# Patient Record
Sex: Male | Born: 1955 | Hispanic: No | Marital: Married | Smoking: Former smoker
Health system: Southern US, Community
[De-identification: ages and names within clinical notes are randomized; demographics above are authoritative.]

## PROBLEM LIST (undated history)

## (undated) DIAGNOSIS — I2510 Atherosclerotic heart disease of native coronary artery without angina pectoris: Secondary | ICD-10-CM

## (undated) DIAGNOSIS — I252 Old myocardial infarction: Secondary | ICD-10-CM

## (undated) DIAGNOSIS — E785 Hyperlipidemia, unspecified: Secondary | ICD-10-CM

## (undated) DIAGNOSIS — I10 Essential (primary) hypertension: Secondary | ICD-10-CM

## (undated) HISTORY — DX: Old myocardial infarction: I25.2

## (undated) HISTORY — PX: VARICOSE VEIN SURGERY: SHX832

## (undated) HISTORY — DX: Atherosclerotic heart disease of native coronary artery without angina pectoris: I25.10

## (undated) HISTORY — PX: CORONARY ANGIOPLASTY: SHX604

## (undated) HISTORY — PX: NASAL SINUS SURGERY: SHX719

## (undated) HISTORY — DX: Essential (primary) hypertension: I10

## (undated) HISTORY — PX: DENTAL SURGERY: SHX609

## (undated) HISTORY — DX: Hyperlipidemia, unspecified: E78.5

---

## 2007-02-13 DIAGNOSIS — I252 Old myocardial infarction: Secondary | ICD-10-CM

## 2007-02-13 HISTORY — DX: Old myocardial infarction: I25.2

## 2011-03-19 DIAGNOSIS — Z0000 Encounter for general adult medical examination without abnormal findings: Secondary | ICD-10-CM | POA: Insufficient documentation

## 2013-03-02 DIAGNOSIS — J329 Chronic sinusitis, unspecified: Secondary | ICD-10-CM | POA: Insufficient documentation

## 2014-09-01 DIAGNOSIS — I2510 Atherosclerotic heart disease of native coronary artery without angina pectoris: Secondary | ICD-10-CM | POA: Insufficient documentation

## 2014-09-01 DIAGNOSIS — I252 Old myocardial infarction: Secondary | ICD-10-CM | POA: Insufficient documentation

## 2015-06-24 ENCOUNTER — Ambulatory Visit: Payer: Self-pay | Admitting: Cardiovascular Disease

## 2015-07-19 ENCOUNTER — Ambulatory Visit: Payer: Self-pay | Admitting: Cardiovascular Disease

## 2015-07-20 ENCOUNTER — Ambulatory Visit: Payer: Self-pay | Admitting: Cardiovascular Disease

## 2015-07-28 ENCOUNTER — Telehealth: Payer: Self-pay | Admitting: Cardiovascular Disease

## 2015-07-28 NOTE — Telephone Encounter (Signed)
Received records from [facility] for appointment on 07/29/15 with Dr Gwenlyn Found.  Records given to [facility] (medical records) for Dr Kennon Holter schedule on 07/29/15.  lp

## 2015-07-29 ENCOUNTER — Ambulatory Visit (INDEPENDENT_AMBULATORY_CARE_PROVIDER_SITE_OTHER): Payer: 59 | Admitting: Cardiovascular Disease

## 2015-07-29 ENCOUNTER — Encounter: Payer: Self-pay | Admitting: Cardiovascular Disease

## 2015-07-29 VITALS — BP 154/85 | HR 86 | Ht 73.0 in | Wt 234.8 lb

## 2015-07-29 DIAGNOSIS — I25700 Atherosclerosis of coronary artery bypass graft(s), unspecified, with unstable angina pectoris: Secondary | ICD-10-CM

## 2015-07-29 DIAGNOSIS — E785 Hyperlipidemia, unspecified: Secondary | ICD-10-CM | POA: Insufficient documentation

## 2015-07-29 DIAGNOSIS — I25810 Atherosclerosis of coronary artery bypass graft(s) without angina pectoris: Secondary | ICD-10-CM | POA: Insufficient documentation

## 2015-07-29 DIAGNOSIS — I10 Essential (primary) hypertension: Secondary | ICD-10-CM | POA: Diagnosis not present

## 2015-07-29 MED ORDER — ASPIRIN 81 MG PO TABS: 81.0000 mg | ORAL_TABLET | Freq: Every day | ORAL | Status: AC

## 2015-07-29 MED ORDER — COQ10 100 MG PO CAPS: 200.0000 mg | ORAL_CAPSULE | Freq: Every day | ORAL | Status: DC

## 2015-07-29 MED ORDER — ATORVASTATIN CALCIUM 40 MG PO TABS: ORAL_TABLET | ORAL | Status: DC

## 2015-07-29 MED ORDER — LISINOPRIL 10 MG PO TABS: ORAL_TABLET | ORAL | Status: DC

## 2015-07-29 NOTE — Assessment & Plan Note (Signed)
History of hyperlipidemia on atorvastatin 40 mg a day with an LDL in the low 70s followed by his PCP.

## 2015-07-29 NOTE — Assessment & Plan Note (Signed)
History of CAD status post inferior STEMI 7/4/09in [place] with stenting of his LAD  2. He is on low-dose aspirin. He denies chest pain or shortness of breath. Apparently his LV function is normal but we will get copies of his most recent [facility] cardiology in [facility]. I will see him back in one year for follow-up

## 2015-07-29 NOTE — Progress Notes (Signed)
     07/29/2015 Randall Estes   Jan 05, 1956  [id number]  Primary Physician Helane Rima, MD Primary Cardiologist: Lorretta Harp MD Renae Gloss  HPI:  Randall Estes this a very pleasant 60 year old mildly overweight married Caucasian male father of 2, grandfather and one grandchild who  does 3-D knitting. He is self-referred to be established in our practice because of known coronary artery disease for ongoing care. He has a history of 70 pack years of tobacco abuse having quit 08/16/07.He does drink 2-3 drinks a day He has treated hypertension and hyperlipidemia. He had an anterior STEMI 08/16/07 in [place] and had stenting of his LAD at that time. Apparently he has preserved LV function. He works out at [employer] 3 times a week and does cardiovascular workout for 45 minutes without symptoms.   No current outpatient prescriptions on file.   No current facility-administered medications for this visit.    Not on File  Social History   Social History  . Marital Status: Married    Spouse Name: N/A  . Number of Children: N/A  . Years of Education: N/A   Occupational History  . Not on file.   Social History Main Topics  . Smoking status: Former Research scientist (life sciences)  . Smokeless tobacco: Not on file  . Alcohol Use: Not on file  . Drug Use: Not on file  . Sexual Activity: Not on file   Other Topics Concern  . Not on file   Social History Narrative  . No narrative on file     Review of Systems: General: negative for chills, fever, night sweats or weight changes.  Cardiovascular: negative for chest pain, dyspnea on exertion, edema, orthopnea, palpitations, paroxysmal nocturnal dyspnea or shortness of breath Dermatological: negative for rash Respiratory: negative for cough or wheezing Urologic: negative for hematuria Abdominal: negative for nausea, vomiting, diarrhea, bright red blood per rectum, melena, or hematemesis Neurologic: negative for visual changes, syncope, or  dizziness All other systems reviewed and are otherwise negative except as noted above.    Blood pressure 154/85, pulse 86, height 6\' 1"  (1.854 m), weight 234 lb 12.8 oz (106.505 kg).  General appearance: alert and no distress Neck: no adenopathy, no carotid bruit, no JVD, supple, symmetrical, trachea midline and thyroid not enlarged, symmetric, no tenderness/mass/nodules Lungs: clear to auscultation bilaterally Heart: regular rate and rhythm, S1, S2 normal, no murmur, click, rub or gallop Extremities: extremities normal, atraumatic, no cyanosis or edema  EKG normal sinus rhythm at 63 with septal Q waves consistent with old septal infarct. I personally reviewed this EKG  ASSESSMENT AND PLAN:   Essential hypertension History of hypertension on lisinopril with blood pressure measured today 154/85. Continue current meds at current dosing  Hyperlipidemia History of hyperlipidemia on atorvastatin 40 mg a day with an LDL in the low 70s followed by his PCP.  CAD (coronary artery disease) of artery bypass graft History of CAD status post inferior STEMI 7/4/09in [place] with stenting of his LAD  2. He is on low-dose aspirin. He denies chest pain or shortness of breath. Apparently his LV function is normal but we will get copies of his most recent [facility] cardiology in [facility]. I will see him back in one year for follow-up      Lorretta Harp MD [facility], [facility] 07/29/2015 8:37 AM

## 2015-07-29 NOTE — Assessment & Plan Note (Signed)
History of hypertension on lisinopril with blood pressure measured today 154/85. Continue current meds at current dosing

## 2015-07-29 NOTE — Patient Instructions (Signed)

## 2016-03-15 DIAGNOSIS — E669 Obesity, unspecified: Secondary | ICD-10-CM | POA: Insufficient documentation

## 2016-03-15 LAB — HM HEPATITIS C SCREENING LAB: HM Hepatitis Screen: NEGATIVE

## 2016-04-10 ENCOUNTER — Other Ambulatory Visit: Payer: Self-pay | Admitting: *Deleted

## 2016-04-10 MED ORDER — LISINOPRIL 10 MG PO TABS: ORAL_TABLET | ORAL | 2 refills | Status: DC

## 2016-06-06 ENCOUNTER — Other Ambulatory Visit: Payer: Self-pay | Admitting: *Deleted

## 2016-06-06 MED ORDER — ATORVASTATIN CALCIUM 40 MG PO TABS: ORAL_TABLET | ORAL | 3 refills | Status: DC

## 2016-06-06 MED ORDER — LISINOPRIL 10 MG PO TABS: ORAL_TABLET | ORAL | 3 refills | Status: DC

## 2016-07-24 DIAGNOSIS — I2510 Atherosclerotic heart disease of native coronary artery without angina pectoris: Secondary | ICD-10-CM | POA: Insufficient documentation

## 2016-07-31 ENCOUNTER — Encounter: Payer: Self-pay | Admitting: Cardiovascular Disease

## 2016-07-31 ENCOUNTER — Ambulatory Visit (INDEPENDENT_AMBULATORY_CARE_PROVIDER_SITE_OTHER): Payer: 59 | Admitting: Cardiovascular Disease

## 2016-07-31 VITALS — BP 150/68 | HR 69 | Ht 73.0 in | Wt 233.6 lb

## 2016-07-31 DIAGNOSIS — I10 Essential (primary) hypertension: Secondary | ICD-10-CM | POA: Diagnosis not present

## 2016-07-31 DIAGNOSIS — I2510 Atherosclerotic heart disease of native coronary artery without angina pectoris: Secondary | ICD-10-CM

## 2016-07-31 DIAGNOSIS — E7800 Pure hypercholesterolemia, unspecified: Secondary | ICD-10-CM

## 2016-07-31 NOTE — Assessment & Plan Note (Signed)
History of essential hypertension blood pressure measured at 150/68. He is on lisinopril. Continue current meds current dosing.

## 2016-07-31 NOTE — Assessment & Plan Note (Signed)
History of CAD status post anterior STEMI 08/16/07 in [place]. With stenting of his LAD at that time. He had preserved LV function. He works out regularly and is denies chest pain or shortness of breath.

## 2016-07-31 NOTE — Progress Notes (Signed)
07/31/2016 Randall Estes   1955/07/12  [id number]  Primary Physician Helane Rima, MD Primary Cardiologist: Lorretta Harp MD Renae Gloss  HPI:  Randall Estes this a very pleasant 61 year old mildly overweight married Caucasian male father of 2, grandfather and one grandchild who  does 3-D knitting. He is self-referred to be established in our practice because of known coronary artery disease for ongoing care. I last saw him in the office 07/29/15. He has a history of 70 pack years of tobacco abuse having quit 08/16/07.He does drink 2-3 drinks a day He has treated hypertension and hyperlipidemia. He had an anterior STEMI 08/16/07 in [place] and had stenting of his LAD at that time. Apparently he had preserved LV function. He works out at [employer] 3 times a week and does cardiovascular workout for 45 minutes without symptoms.   Current Outpatient Prescriptions  Medication Sig Dispense Refill  . aspirin 81 MG tablet Take 1 tablet (81 mg total) by mouth daily. 90 tablet 3  . atorvastatin (LIPITOR) 40 MG tablet TAKE 1 TABLET (40 MG TOTAL) BY MOUTH DAILY. 90 tablet 3  . Coenzyme Q10 (COQ10) 100 MG CAPS Take 200 mg by mouth daily. 90 each 3  . lisinopril (PRINIVIL,ZESTRIL) 10 MG tablet TAKE 0.5 TABLETS (5 MG TOTAL) BY MOUTH DAILY. 45 tablet 3  . MULTIPLE VITAMIN PO Take 1 tablet by mouth daily.     No current facility-administered medications for this visit.     Not on File  Social History   Social History  . Marital status: Married    Spouse name: N/A  . Number of children: N/A  . Years of education: N/A   Occupational History  . Not on file.   Social History Main Topics  . Smoking status: Former Research scientist (life sciences)  . Smokeless tobacco: Never Used  . Alcohol use Not on file  . Drug use: Unknown  . Sexual activity: Not on file   Other Topics Concern  . Not on file   Social History Narrative  . No narrative on file     Review of Systems: General: negative for  chills, fever, night sweats or weight changes.  Cardiovascular: negative for chest pain, dyspnea on exertion, edema, orthopnea, palpitations, paroxysmal nocturnal dyspnea or shortness of breath Dermatological: negative for rash Respiratory: negative for cough or wheezing Urologic: negative for hematuria Abdominal: negative for nausea, vomiting, diarrhea, bright red blood per rectum, melena, or hematemesis Neurologic: negative for visual changes, syncope, or dizziness All other systems reviewed and are otherwise negative except as noted above.    Blood pressure (!) 150/68, pulse 69, height 6\' 1"  (1.854 m), weight 233 lb 9.6 oz (106 kg).  General appearance: cooperative and no distress Neck: no adenopathy, no carotid bruit, no JVD, supple, symmetrical, trachea midline and thyroid not enlarged, symmetric, no tenderness/mass/nodules Lungs: clear to auscultation bilaterally Heart: regular rate and rhythm, S1, S2 normal, no murmur, click, rub or gallop Extremities: extremities normal, atraumatic, no cyanosis or edema  EKG sinus rhythm at 69 with septal Q waves and left axis deviation. Per se reviewed this EKG.  ASSESSMENT AND PLAN:   Essential hypertension History of essential hypertension blood pressure measured at 150/68. He is on lisinopril. Continue current meds current dosing.  Hyperlipidemia History of hyperlipidemia on statin therapy followed by his PCP.  CAD (coronary artery disease) History of CAD status post anterior STEMI 08/16/07 in [place]. With stenting of his LAD at that time. He had preserved LV  function. He works out regularly and is denies chest pain or shortness of breath.      Lorretta Harp MD FACP,FACC,FAHA, [facility] 07/31/2016 3:17 PM

## 2016-07-31 NOTE — Assessment & Plan Note (Signed)
History of hyperlipidemia on statin therapy followed by his PCP 

## 2016-07-31 NOTE — Patient Instructions (Signed)

## 2016-09-17 NOTE — Progress Notes (Signed)
[facility] [address] [place], [place] [address] Phone: [phone] Subjective:    I'm seeing this patient by the request  of:    CC: Buttocks pain  [id number]  Randall Estes is a 61 y.o. male coming in with complaint of right glute pain. He has had the pain since last summer.  No mechanism of injury.  Onset- last summer Location- right Duration- intermittent Character- sharp, dull, achy Aggravating factors- patient cannot find pattern Reliving factors- stretching Therapies tried-  Severity- 10/10 at it's worst    Past Medical History:  Diagnosis Date  . Coronary artery disease   . Hyperlipidemia   . Hypertension   . Old MI (myocardial infarction)    Past Surgical History:  Procedure Laterality Date  . CORONARY ANGIOPLASTY     Social History   Social History  . Marital status: Married    Spouse name: N/A  . Number of children: N/A  . Years of education: N/A   Social History Main Topics  . Smoking status: Former Research scientist (life sciences)  . Smokeless tobacco: Never Used  . Alcohol use Not on file  . Drug use: Unknown  . Sexual activity: Not on file   Other Topics Concern  . Not on file   Social History Narrative  . No narrative on file   Not on File No family history on file.   Past medical history, social, surgical and family history all reviewed in electronic medical record.  No pertanent information unless stated regarding to the chief complaint.   Review of Systems:Review of systems updated and as accurate as of 09/17/16  No headache, visual changes, nausea, vomiting, diarrhea, constipation, dizziness, abdominal pain, skin rash, fevers, chills, night sweats, weight loss, swollen lymph nodes, body aches, joint swelling, muscle aches, chest pain, shortness of breath, mood changes.   Objective  There were no vitals taken for this visit. Systems examined below as of 09/17/16   General: No apparent distress alert and oriented x3  mood and affect normal, dressed appropriately.  HEENT: Pupils equal, extraocular movements intact  Respiratory: Patient's speak in full sentences and does not appear short of breath  Cardiovascular: No lower extremity edema, non tender, no erythema  Skin: Warm dry intact with no signs of infection or rash on extremities or on axial skeleton.  Abdomen: Soft nontender  Neuro: Cranial nerves II through XII are intact, neurovascularly intact in all extremities with 2+ DTRs and 2+ pulses.  Lymph: No lymphadenopathy of posterior or anterior cervical chain or axillae bilaterally.  Gait normal with good balance and coordination.  MSK:  Non tender with full range of motion and good stability and symmetric strength and tone of shoulders, elbows, wrist, hip, knee and ankles bilaterally.  Back Exam:  Inspection: Unremarkable  Motion: Flexion 35 deg, Extension 25 deg, Side Bending to 35 deg bilaterally,  Rotation to 35 deg bilaterally  SLR laying: Negative  XSLR laying: Negative  Palpable tenderness: Tenderness over the gluteus medius on the right side only. FABER: negative. Sensory change: Gross sensation intact to all lumbar and sacral dermatomes.  Reflexes: 2+ at both patellar tendons, 2+ at achilles tendons, Babinski's downgoing.  Strength at foot  Plantar-flexion: 5/5 Dorsi-flexion: 5/5 Eversion: 5/5 Inversion: 5/5  Leg strength  Quad: 5/5 Hamstring: 5/5 Hip flexor: 5/5 Hip abductors: 5/5  Gait unremarkable.  Procedure note 24235; 15 additional minutes spent for Therapeutic exercises as stated in above notes.  This included exercises focusing on stretching, strengthening, with  significant focus on eccentric aspects.   Long term goals include an improvement in range of motion, strength, endurance as well as avoiding reinjury. Patient's frequency would include in 1-2 times a day, 3-5 times a week for a duration of 6-12 weeks.  Hip strengthening exercises which included:  Pelvic tilt/bracing to help  with proper recruitment of the lower abs and pelvic floor muscles  Glute strengthening to properly contract glutes without over-engaging low back and hamstrings - prone hip extension and glute bridge exercises Proper stretching techniques to increase effectiveness for the hip flexors, groin, quads, piriformic and low back when appropriate   Proper technique shown and discussed handout in great detail with ATC.  All questions were discussed and answered.     Impression and Recommendations:     This case required medical decision making of moderate complexity.      Note: This dictation was prepared with Dragon dictation along with smaller phrase technology. Any transcriptional errors that result from this process are unintentional.

## 2016-09-18 ENCOUNTER — Ambulatory Visit (INDEPENDENT_AMBULATORY_CARE_PROVIDER_SITE_OTHER): Payer: 59 | Admitting: Family Medicine

## 2016-09-18 ENCOUNTER — Encounter: Payer: Self-pay | Admitting: Family Medicine

## 2016-09-18 DIAGNOSIS — M6798 Unspecified disorder of synovium and tendon, other site: Secondary | ICD-10-CM | POA: Insufficient documentation

## 2016-09-18 DIAGNOSIS — M67951 Unspecified disorder of synovium and tendon, right thigh: Secondary | ICD-10-CM | POA: Insufficient documentation

## 2016-09-18 MED ORDER — DICLOFENAC SODIUM 2 % TD SOLN: 2.0000 "application " | Freq: Two times a day (BID) | TRANSDERMAL | 3 refills | Status: DC

## 2016-09-18 MED ORDER — VITAMIN D (ERGOCALCIFEROL) 1.25 MG (50000 UNIT) PO CAPS: 50000.0000 [IU] | ORAL_CAPSULE | ORAL | 0 refills | Status: DC

## 2016-09-18 NOTE — Patient Instructions (Addendum)
Good to see you.  Ice 20 minutes 2 times daily. Usually after activity and before bed. Exercises 3 times a week.  Decrease elliptical to 51minutes and add 20 minutes of bike.  pennsaid pinkie amount topically 2 times daily as needed.   Once weekly vitamin D for 12 weeks for strength  Stay active Tennis ball in back right pocket when sitting a lot See me again in 3-4 weeks and if not a lot better lets inject

## 2016-09-18 NOTE — Assessment & Plan Note (Signed)
Likely sprain. Discussed with patient at great length, we discussed objective is a doing which ones to avoid. Patient is to increase activity as tolerated. Patient work with a Clinical research associate to discuss home exercises for this. We discussed the possibility of a lumbar radiculopathy. Patient will try once the vitamin D for strength. We also discussed the topical anti-inflammatory for pain relief. Icing regimen. Return in 3-4 weeks. Worsening pain we'll consider injection

## 2016-10-09 ENCOUNTER — Ambulatory Visit: Payer: Self-pay

## 2016-10-09 ENCOUNTER — Ambulatory Visit (INDEPENDENT_AMBULATORY_CARE_PROVIDER_SITE_OTHER): Payer: 59 | Admitting: Family Medicine

## 2016-10-09 ENCOUNTER — Encounter: Payer: Self-pay | Admitting: Family Medicine

## 2016-10-09 ENCOUNTER — Ambulatory Visit (INDEPENDENT_AMBULATORY_CARE_PROVIDER_SITE_OTHER)
Admission: RE | Admit: 2016-10-09 | Discharge: 2016-10-09 | Disposition: A | Discharge status: Home / Self Care | Payer: 59 | Source: Ambulatory Visit | Attending: Family Medicine | Admitting: Family Medicine

## 2016-10-09 VITALS — BP 140/78 | HR 71 | Wt 235.0 lb

## 2016-10-09 DIAGNOSIS — M6798 Unspecified disorder of synovium and tendon, other site: Secondary | ICD-10-CM

## 2016-10-09 DIAGNOSIS — M67951 Unspecified disorder of synovium and tendon, right thigh: Secondary | ICD-10-CM

## 2016-10-09 MED ORDER — GABAPENTIN 100 MG PO CAPS: 200.0000 mg | ORAL_CAPSULE | Freq: Every day | ORAL | 3 refills | Status: DC

## 2016-10-09 NOTE — Progress Notes (Signed)
[facility] [address] [place], [place] [address] Phone: [phone] Subjective:    CC: Buttocks pain  PIR:[id number]  Randall Estes is a 62 y.o. male coming in with complaint of right glute pain. Positive pain. Patient was found to have more of a gluteal tendinitis. Had doing the home exercises but no significant improvement. Did get the different vitamins with no significant improvement. Continues to have dull, throbbing aching pain. Patient is supposed to be traveling and is concerned second her to the discomfort and pain.    Past Medical History:  Diagnosis Date  . Coronary artery disease   . Hyperlipidemia   . Hypertension   . Old MI (myocardial infarction)    Past Surgical History:  Procedure Laterality Date  . CORONARY ANGIOPLASTY     Social History   Social History  . Marital status: Married    Spouse name: N/A  . Number of children: N/A  . Years of education: N/A   Social History Main Topics  . Smoking status: Former Research scientist (life sciences)  . Smokeless tobacco: Never Used  . Alcohol use None  . Drug use: Unknown  . Sexual activity: Not Asked   Other Topics Concern  . None   Social History Narrative  . None   Not on File No family history on file. No known drug allergies   Past medical history, social, surgical and family history all reviewed in electronic medical record.  No pertanent information unless stated regarding to the chief complaint.   Review of Systems:Review of systems updated and as accurate as of 10/09/16  No headache, visual changes, nausea, vomiting, diarrhea, constipation, dizziness, abdominal pain, skin rash, fevers, chills, night sweats, weight loss, swollen lymph nodes, body aches, joint swelling, chest pain, shortness of breath, mood changes. Positive muscle aches  Objective  Blood pressure 140/78, pulse 71, weight 235 lb (106.6 kg), SpO2 96 %.   Systems examined below as of 10/09/16 General: NAD A&O x3 mood,  affect normal  HEENT: Pupils equal, extraocular movements intact no nystagmus Respiratory: not short of breath at rest or with speaking Cardiovascular: No lower extremity edema, non tender Skin: Warm dry intact with no signs of infection or rash on extremities or on axial skeleton. Abdomen: Soft nontender, no masses Neuro: Cranial nerves  intact, neurovascularly intact in all extremities with 2+ DTRs and 2+ pulses. Lymph: No lymphadenopathy appreciated today  Gait normal with good balance and coordination.  MSK: Non tender with full range of motion and good stability and symmetric strength and tone of shoulders, elbows, wrist,  knee hips and ankles bilaterally.   Back Exam:  Inspection: Unremarkable  Motion: Flexion 35 deg, Extension 25 deg, Side Bending to 35 deg bilaterally,  Rotation to 35 deg bilaterally  SLR laying: Negative  XSLR laying: Negative  Palpable tenderness: Tenderness over the gluteus medius on the right side only. FABER: negative. Sensory change: Gross sensation intact to all lumbar and sacral dermatomes.  Reflexes: 2+ at both patellar tendons, 2+ at achilles tendons, Babinski's downgoing.  Strength at foot  Plantar-flexion: 5/5 Dorsi-flexion: 5/5 Eversion: 5/5 Inversion: 5/5  Leg strength  Quad: 5/5 Hamstring: 5/5 Hip flexor: 5/5 Hip abductors: 5/5  Gait unremarkable.  Procedure: Real-time Ultrasound Guided Injection of right gluteal tendon sheath Device: GE Logiq Q7 Ultrasound guided injection is preferred based studies that show increased duration, increased effect, greater accuracy, decreased procedural pain, increased response rate, and decreased cost with ultrasound guided versus blind injection.  Verbal informed  consent obtained.  Time-out conducted.  Noted no overlying erythema, induration, or other signs of local infection.  Skin prepped in a sterile fashion.  Local anesthesia: Topical Ethyl chloride.  With sterile technique and under real time ultrasound  guidance: With a 21-gauge 2 inch needle patient was injected with a total of 1 mL of 0.5% Marcaine and 1 mL of Kenalog 40 mg/dL into the tendon sheath.  Completed without difficulty  Pain immediately resolved suggesting accurate placement of the medication.  Advised to call if fevers/chills, erythema, induration, drainage, or persistent bleeding.  Images permanently stored and available for review in the ultrasound unit.  Impression: Technically successful ultrasound guided injection.      Impression and Recommendations:     This case required medical decision making of moderate complexity.      Note: This dictation was prepared with Dragon dictation along with smaller phrase technology. Any transcriptional errors that result from this process are unintentional.

## 2016-10-09 NOTE — Assessment & Plan Note (Signed)
No improvement with conservative therapy. Injected today. Discussed icing regimen, home exercises roughly patient does significant a better. Patient could be a candidate for PRP. X-rays lumbar spine ordered today to further evaluate for any lumbar radiculopathy that is within the differential. Encouraged gabapentin at night. Follow-up again in 3-4 weeks

## 2016-10-09 NOTE — Patient Instructions (Addendum)
Good to see you  Injected the glut tendon today and I hope it helps.  Ice is your friend Xray of back downstairs Gabapentin 200mg  at night Take it easy for 2 days then start the exercises again.  See me again in 3 weeks.

## 2016-10-30 ENCOUNTER — Ambulatory Visit (INDEPENDENT_AMBULATORY_CARE_PROVIDER_SITE_OTHER): Payer: 59 | Admitting: Family Medicine

## 2016-10-30 ENCOUNTER — Encounter: Payer: Self-pay | Admitting: Family Medicine

## 2016-10-30 DIAGNOSIS — M6798 Unspecified disorder of synovium and tendon, other site: Secondary | ICD-10-CM | POA: Diagnosis not present

## 2016-10-30 DIAGNOSIS — M67951 Unspecified disorder of synovium and tendon, right thigh: Secondary | ICD-10-CM

## 2016-10-30 NOTE — Assessment & Plan Note (Signed)
Patient didn't do well with the injection. We discussed the possibility of nitroglycerin as well as PRP. Patient declined both with him doing better. We discussed the possibility of the gabapentin as well which patient has not been taking. Patient will continue conservative therapy and see me again on an as-needed basis. Can repeat injection every 10 weeks if needed

## 2016-10-30 NOTE — Patient Instructions (Signed)
6 weeks

## 2016-10-30 NOTE — Progress Notes (Signed)
[facility] [address] [place], [place] [address] Phone: [phone] Subjective:    CC: Buttocks pain  [id number]  Randall Estes is a 61 y.o. male coming in with complaint of right glute pain. Positive pain. Patient was found to have more of a gluteal tendinitis. Patient was having worsening pain and we did give an injection. Patient did tolerate the procedure well 4 weeks ago. States that 70% of the pain is significantly improved. Patient still states that sitting seems to give it some discomfort. His lungs patient does some home exercises, stretches, he seems to do well. Has been working on a more regular basis. Fairly happy with the results.    Past Medical History:  Diagnosis Date  . Coronary artery disease   . Hyperlipidemia   . Hypertension   . Old MI (myocardial infarction)    Past Surgical History:  Procedure Laterality Date  . CORONARY ANGIOPLASTY     Social History   Social History  . Marital status: Married    Spouse name: N/A  . Number of children: N/A  . Years of education: N/A   Social History Main Topics  . Smoking status: Former Research scientist (life sciences)  . Smokeless tobacco: Never Used  . Alcohol use None  . Drug use: Unknown  . Sexual activity: Not Asked   Other Topics Concern  . None   Social History Narrative  . None   Not on File No family history on file. No known drug allergies   Past medical history, social, surgical and family history all reviewed in electronic medical record.  No pertanent information unless stated regarding to the chief complaint.   Review of Systems: No headache, visual changes, nausea, vomiting, diarrhea, constipation, dizziness, abdominal pain, skin rash, fevers, chills, night sweats, weight loss, swollen lymph nodes, body aches, joint swelling, muscle aches, chest pain, shortness of breath, mood changes.    Objective  Blood pressure (!) 142/78, pulse 71, height 6\' 1"  (1.854 m), weight 235 lb  (106.6 kg), SpO2 96 %.   Systems examined below as of 10/30/16 General: NAD A&O x3 mood, affect normal  HEENT: Pupils equal, extraocular movements intact no nystagmus Respiratory: not short of breath at rest or with speaking Cardiovascular: No lower extremity edema, non tender Skin: Warm dry intact with no signs of infection or rash on extremities or on axial skeleton. Abdomen: Soft nontender, no masses Neuro: Cranial nerves  intact, neurovascularly intact in all extremities with 2+ DTRs and 2+ pulses. Lymph: No lymphadenopathy appreciated today  Gait normal with good balance and coordination.  MSK: Non tender with full range of motion and good stability and symmetric strength and tone of shoulders, elbows, wrist,  knee hips and ankles bilaterally.  .   Back Exam:  Inspection: Unremarkable  Motion: Flexion 35 deg, Extension 25 deg, Side Bending to 35 deg bilaterally,  Rotation to 35 deg bilaterally  SLR laying: Negative  XSLR laying: Negative  Palpable tenderness: Minimal pain over the right gluteal area. Significant improvement from previous exam FABER: negative. Sensory change: Gross sensation intact to all lumbar and sacral dermatomes.  Reflexes: 2+ at both patellar tendons, 2+ at achilles tendons, Babinski's downgoing.  Strength at foot  Plantar-flexion: 5/5 Dorsi-flexion: 5/5 Eversion: 5/5 Inversion: 5/5  Leg strength  Quad: 5/5 Hamstring: 5/5 Hip flexor: 5/5 Hip abductors: 5/5  Gait unremarkable.    Impression and Recommendations:     This case required medical decision making of moderate complexity.  Note: This dictation was prepared with Dragon dictation along with smaller phrase technology. Any transcriptional errors that result from this process are unintentional.

## 2016-12-11 ENCOUNTER — Ambulatory Visit: Payer: Self-pay

## 2016-12-11 ENCOUNTER — Encounter: Payer: Self-pay | Admitting: Family Medicine

## 2016-12-11 ENCOUNTER — Ambulatory Visit (INDEPENDENT_AMBULATORY_CARE_PROVIDER_SITE_OTHER): Payer: 59 | Admitting: Family Medicine

## 2016-12-11 VITALS — BP 132/82 | HR 66 | Wt 242.0 lb

## 2016-12-11 DIAGNOSIS — M6798 Unspecified disorder of synovium and tendon, other site: Secondary | ICD-10-CM

## 2016-12-11 DIAGNOSIS — M67951 Unspecified disorder of synovium and tendon, right thigh: Secondary | ICD-10-CM

## 2016-12-11 DIAGNOSIS — M7918 Myalgia, other site: Secondary | ICD-10-CM

## 2016-12-11 NOTE — Assessment & Plan Note (Signed)
Repeat injection given today. We discussed icing regimen. Discussed the importance of hip abductor strengthening. We discussed if this continues we will need to consider the possibility of a or P further imaging. Differential includes neurovascular compromise either from the lumbar spine or patient's atherosclerosis. Patient continue with conservative therapy and see me again in 3-4 weeks.

## 2016-12-11 NOTE — Patient Instructions (Signed)
Good to see you  I hope this knocks the rest of the pain out.  We need to consider the possibility of the PRP Continue the medicines.  Increase activity as tolerated.  See me again in 4 weeks if needed.

## 2016-12-11 NOTE — Progress Notes (Signed)
[facility] [address] [place], [place] [address] Phone: [phone] Subjective:     CC: Gluteal tendon follow-up  [id number]  Randall Estes is a 61 y.o. male coming in with complaint of gluteal tendinitis. Patient elected try an injection back in 10/09/2016. Found to have 70% improvement in last follow-up. He has been having good and bad days. He said that he is doing better but he still has a lot of tightness. Stretching causes instant relief. He has been doing recumbent biking instead of elliptical. Patient states unfortunately starting have some mild worsening pain again. Feels like he is having some discomfort overall. Concern that it is coming back. Does not want to do the PRP.     Past Medical History:  Diagnosis Date  . Coronary artery disease   . Hyperlipidemia   . Hypertension   . Old MI (myocardial infarction)    Past Surgical History:  Procedure Laterality Date  . CORONARY ANGIOPLASTY     Social History   Social History  . Marital status: Married    Spouse name: N/A  . Number of children: N/A  . Years of education: N/A   Social History Main Topics  . Smoking status: Former Research scientist (life sciences)  . Smokeless tobacco: Never Used  . Alcohol use Not on file  . Drug use: Unknown  . Sexual activity: Not on file   Other Topics Concern  . Not on file   Social History Narrative  . No narrative on file   Not on File No family history on file.   Past medical history, social, surgical and family history all reviewed in electronic medical record.  No pertanent information unless stated regarding to the chief complaint.   Review of Systems:Review of systems updated and as accurate as of 12/11/16  No headache, visual changes, nausea, vomiting, diarrhea, constipation, dizziness, abdominal pain, skin rash, fevers, chills, night sweats, weight loss, swollen lymph nodes, body aches, joint swelling, chest pain, shortness of breath, mood changes.  Positive muscle aches  Objective  There were no vitals taken for this visit. Systems examined below as of 12/11/16   General: No apparent distress alert and oriented x3 mood and affect normal, dressed appropriately.  HEENT: Pupils equal, extraocular movements intact  Respiratory: Patient's speak in full sentences and does not appear short of breath  Cardiovascular: No lower extremity edema, non tender, no erythema  Skin: Warm dry intact with no signs of infection or rash on extremities or on axial skeleton.  Abdomen: Soft nontender  Neuro: Cranial nerves II through XII are intact, neurovascularly intact in all extremities with 2+ DTRs and 2+ pulses.  Lymph: No lymphadenopathy of posterior or anterior cervical chain or axillae bilaterally.  Gait normal with good balance and coordination.  MSK:  Non tender with full range of motion and good stability and symmetric strength and tone of shoulders, elbows, wrist, hip, knee and ankles bilaterally.  Back exam shows the patient has a negative straight leg test. Patient's pain is all in the gluteal tendon. Patient has a mild positive Corky Sox test.  Procedure: Real-time Ultrasound Guided Injection of right gluteal tendon sheath Device: GE Logiq Q7 Ultrasound guided injection is preferred based studies that show increased duration, increased effect, greater accuracy, decreased procedural pain, increased response rate, and decreased cost with ultrasound guided versus blind injection.  Verbal informed consent obtained.  Time-out conducted.  Noted no overlying erythema, induration, or other signs of local infection.  Skin prepped in a  sterile fashion.  Local anesthesia: Topical Ethyl chloride.  With sterile technique and under real time ultrasound guidance: Patient with a 21-gauge 3 inch needle was injected with a total of 2 mL of 0.5% Marcaine and 1 mL of Kenalog 40 mg/dL in the gluteal tendon.  Completed without difficulty  Pain immediately resolved  suggesting accurate placement of the medication.  Advised to call if fevers/chills, erythema, induration, drainage, or persistent bleeding.  Images permanently stored and available for review in the ultrasound unit.  Impression: Technically successful ultrasound guided injection.   Impression and Recommendations:     This case required medical decision making of moderate complexity.      Note: This dictation was prepared with Dragon dictation along with smaller phrase technology. Any transcriptional errors that result from this process are unintentional.

## 2017-01-08 ENCOUNTER — Ambulatory Visit: Payer: Self-pay | Admitting: Family Medicine

## 2017-07-04 ENCOUNTER — Other Ambulatory Visit: Payer: Self-pay | Admitting: Cardiovascular Disease

## 2017-07-11 ENCOUNTER — Ambulatory Visit (INDEPENDENT_AMBULATORY_CARE_PROVIDER_SITE_OTHER): Payer: Managed Care, Other (non HMO) | Admitting: Family Medicine

## 2017-07-11 ENCOUNTER — Encounter: Payer: Self-pay | Admitting: Family Medicine

## 2017-07-11 VITALS — BP 134/78 | HR 86 | Temp 98.6°F | Ht 73.0 in | Wt 245.6 lb

## 2017-07-11 DIAGNOSIS — I25700 Atherosclerosis of coronary artery bypass graft(s), unspecified, with unstable angina pectoris: Secondary | ICD-10-CM | POA: Diagnosis not present

## 2017-07-11 DIAGNOSIS — Z0001 Encounter for general adult medical examination with abnormal findings: Secondary | ICD-10-CM

## 2017-07-11 DIAGNOSIS — I10 Essential (primary) hypertension: Secondary | ICD-10-CM | POA: Diagnosis not present

## 2017-07-11 DIAGNOSIS — Z125 Encounter for screening for malignant neoplasm of prostate: Secondary | ICD-10-CM

## 2017-07-11 DIAGNOSIS — E7800 Pure hypercholesterolemia, unspecified: Secondary | ICD-10-CM | POA: Diagnosis not present

## 2017-07-11 NOTE — Patient Instructions (Addendum)
It was very nice to see you today!  No changes today.  I will see you in 1 year or sooner as needed.  Take care, Dr Jerline Pain   Preventive Care 40-64 Years, Male Preventive care refers to lifestyle choices and visits with your health care provider that can promote health and wellness. What does preventive care include?  A yearly physical exam. This is also called an annual well check.  Dental exams once or twice a year.  Routine eye exams. Ask your health care provider how often you should have your eyes checked.  Personal lifestyle choices, including: ? Daily care of your teeth and gums. ? Regular physical activity. ? Eating a healthy diet. ? Avoiding tobacco and drug use. ? Limiting alcohol use. ? Practicing safe sex. ? Taking low-dose aspirin every day starting at age 61. What happens during an annual well check? The services and screenings done by your health care provider during your annual well check will depend on your age, overall health, lifestyle risk factors, and family history of disease. Counseling Your health care provider may ask you questions about your:  Alcohol use.  Tobacco use.  Drug use.  Emotional well-being.  Home and relationship well-being.  Sexual activity.  Eating habits.  Work and work Statistician.  Screening You may have the following tests or measurements:  Height, weight, and BMI.  Blood pressure.  Lipid and cholesterol levels. These may be checked every 5 years, or more frequently if you are over 10 years old.  Skin check.  Lung cancer screening. You may have this screening every year starting at age 14 if you have a 30-pack-year history of smoking and currently smoke or have quit within the past 15 years.  Fecal occult blood test (FOBT) of the stool. You may have this test every year starting at age 27.  Flexible sigmoidoscopy or colonoscopy. You may have a sigmoidoscopy every 5 years or a colonoscopy every 10 years  starting at age 33.  Prostate cancer screening. Recommendations will vary depending on your family history and other risks.  Hepatitis C blood test.  Hepatitis B blood test.  Sexually transmitted disease (STD) testing.  Diabetes screening. This is done by checking your blood sugar (glucose) after you have not eaten for a while (fasting). You may have this done every 1-3 years.  Discuss your test results, treatment options, and if necessary, the need for more tests with your health care provider. Vaccines Your health care provider may recommend certain vaccines, such as:  Influenza vaccine. This is recommended every year.  Tetanus, diphtheria, and acellular pertussis (Tdap, Td) vaccine. You may need a Td booster every 10 years.  Varicella vaccine. You may need this if you have not been vaccinated.  Zoster vaccine. You may need this after age 68.  Measles, mumps, and rubella (MMR) vaccine. You may need at least one dose of MMR if you were born in 1957 or later. You may also need a second dose.  Pneumococcal 13-valent conjugate (PCV13) vaccine. You may need this if you have certain conditions and have not been vaccinated.  Pneumococcal polysaccharide (PPSV23) vaccine. You may need one or two doses if you smoke cigarettes or if you have certain conditions.  Meningococcal vaccine. You may need this if you have certain conditions.  Hepatitis A vaccine. You may need this if you have certain conditions or if you travel or work in places where you may be exposed to hepatitis A.  Hepatitis B vaccine. You  may need this if you have certain conditions or if you travel or work in places where you may be exposed to hepatitis B.  Haemophilus influenzae type b (Hib) vaccine. You may need this if you have certain risk factors.  Talk to your health care provider about which screenings and vaccines you need and how often you need them. This information is not intended to replace advice given to  you by your health care provider. Make sure you discuss any questions you have with your health care provider. Document Released: 02/25/2015 Document Revised: 10/19/2015 Document Reviewed: 11/30/2014 Elsevier Interactive Patient Education  [employer].

## 2017-07-11 NOTE — Assessment & Plan Note (Signed)
Continue Lipitor 40 mg daily.  Check lipid panel with next blood draw. 

## 2017-07-11 NOTE — Assessment & Plan Note (Signed)
At goal.  Continue lisinopril 10 mg daily.  Check CMET.

## 2017-07-11 NOTE — Assessment & Plan Note (Signed)
Stable.  Continue aspirin and Lipitor.

## 2017-07-11 NOTE — Progress Notes (Signed)
Subjective:  Randall Estes is a 62 y.o. male who presents today for his annual comprehensive physical exam and to establish care.   HPI:  He has no acute complaints today.   Lifestyle Diet: No specific diets. Has tried keto diet.  Exercise: Goes to gym 3 times per week. 45 minutes of cardio.   Depression screen PHQ 2/9 07/11/2017  Decreased Interest 0  Down, Depressed, Hopeless 0  PHQ - 2 Score 0   Health Maintenance Due  Topic Date Due  . HIV Screening  12/10/1970  . TETANUS/TDAP  12/10/1974     ROS: Per HPI, otherwise a complete review of systems was negative.   PMH:  The following were reviewed and entered/updated in epic: Past Medical History:  Diagnosis Date  . Coronary artery disease   . Hyperlipidemia   . Hypertension   . Old MI (myocardial infarction) 2009   Patient Active Problem List   Diagnosis Date Noted  . Tendinopathy of right gluteus medius 09/18/2016  . Obesity (BMI 30-39.9) 03/15/2016  . Essential hypertension 07/29/2015  . Hyperlipidemia 07/29/2015  . CAD (coronary artery disease) of artery bypass graft 07/29/2015  . MI, old 09/01/2014   Past Surgical History:  Procedure Laterality Date  . CORONARY ANGIOPLASTY    . DENTAL SURGERY    . NASAL SINUS SURGERY    . VARICOSE VEIN SURGERY      Family History  Problem Relation Age of Onset  . Prostate cancer Father   . Cancer Paternal Grandmother   . Heart disease Neg Hx     Medications- reviewed and updated Current Outpatient Medications  Medication Sig Dispense Refill  . aspirin 81 MG tablet Take 1 tablet (81 mg total) by mouth daily. 90 tablet 3  . atorvastatin (LIPITOR) 40 MG tablet TAKE 1 TABLET (40 MG TOTAL) BY MOUTH DAILY. 90 tablet 3  . Coenzyme Q10 (COQ10) 100 MG CAPS Take 200 mg by mouth daily. 90 each 3  . lisinopril (PRINIVIL,ZESTRIL) 10 MG tablet TAKE 0.5 TABLETS (5 MG TOTAL) BY MOUTH DAILY. 45 tablet 3   No current facility-administered medications for this visit.      Allergies-reviewed and updated No Known Allergies  Social History   Socioeconomic History  . Marital status: Married    Spouse name: Not on file  . Number of children: Not on file  . Years of education: Not on file  . Highest education level: Not on file  Occupational History  . Not on file  Social Needs  . Financial resource strain: Not on file  . Food insecurity:    Worry: Not on file    Inability: Not on file  . Transportation needs:    Medical: Not on file    Non-medical: Not on file  Tobacco Use  . Smoking status: Former Research scientist (life sciences)  . Smokeless tobacco: Never Used  . Tobacco comment: 2009  Substance and Sexual Activity  . Alcohol use: Yes    Comment: 14  . Drug use: Never  . Sexual activity: Yes    Partners: Female  Lifestyle  . Physical activity:    Days per week: Not on file    Minutes per session: Not on file  . Stress: Not on file  Relationships  . Social connections:    Talks on phone: Not on file    Gets together: Not on file    Attends religious service: Not on file    Active member of club or organization: Not on file  Attends meetings of clubs or organizations: Not on file    Relationship status: Not on file  Other Topics Concern  . Not on file  Social History Narrative  . Not on file    Objective:  Physical Exam: BP 134/78 (BP Location: Left Arm, Patient Position: Sitting, Cuff Size: Normal)   Pulse 86   Temp 98.6 F (37 C) (Oral)   Ht 6\' 1"  (1.854 m)   Wt 245 lb 9.6 oz (111.4 kg)   SpO2 96%   BMI 32.40 kg/m   Body mass index is 32.4 kg/m. Wt Readings from Last 3 Encounters:  07/11/17 245 lb 9.6 oz (111.4 kg)  12/11/16 242 lb (109.8 kg)  10/30/16 235 lb (106.6 kg)   Gen: NAD, resting comfortably HEENT: TMs normal bilaterally. OP clear. No thyromegaly noted.  CV: RRR with no murmurs appreciated Pulm: NWOB, CTAB with no crackles, wheezes, or rhonchi GI: Normal bowel sounds present. Soft, Nontender, Nondistended. MSK: no edema,  cyanosis, or clubbing noted Skin: warm, dry Neuro: CN2-12 grossly intact. Strength 5/5 in upper and lower extremities. Reflexes symmetric and intact bilaterally.  Psych: Normal affect and thought content  Assessment/Plan:  Essential hypertension At goal.  Continue lisinopril 10 mg daily.  Check CMET.  Hyperlipidemia Continue Lipitor 40 mg daily.  Check lipid panel with next blood draw.  CAD (coronary artery disease) of artery bypass graft Stable.  Continue aspirin and Lipitor.  Preventative Healthcare: Patient will come back for fasting labs.  Check PSA and lipid panel.  Patient Counseling(The following topics were reviewed and/or handout was given):  -Nutrition: Stressed importance of moderation in sodium/caffeine intake, saturated fat and cholesterol, caloric balance, sufficient intake of fresh fruits, vegetables, and fiber.  -Stressed the importance of regular exercise.   -Substance Abuse: Discussed cessation/primary prevention of tobacco, alcohol, or other drug use; driving or other dangerous activities under the influence; availability of treatment for abuse.   -Injury prevention: Discussed safety belts, safety helmets, smoke detector, smoking near bedding or upholstery.   -Sexuality: Discussed sexually transmitted diseases, partner selection, use of condoms, avoidance of unintended pregnancy and contraceptive alternatives.   -Dental health: Discussed importance of regular tooth brushing, flossing, and dental visits.  -Health maintenance and immunizations reviewed. Please refer to Health maintenance section.  Return to care in 1 year for next preventative visit.   Algis Greenhouse. Jerline Pain, MD 07/11/2017 4:19 PM

## 2017-07-25 ENCOUNTER — Other Ambulatory Visit (INDEPENDENT_AMBULATORY_CARE_PROVIDER_SITE_OTHER): Payer: Managed Care, Other (non HMO)

## 2017-07-25 ENCOUNTER — Telehealth: Payer: Self-pay | Admitting: Family Medicine

## 2017-07-25 DIAGNOSIS — I25700 Atherosclerosis of coronary artery bypass graft(s), unspecified, with unstable angina pectoris: Secondary | ICD-10-CM | POA: Diagnosis not present

## 2017-07-25 DIAGNOSIS — Z125 Encounter for screening for malignant neoplasm of prostate: Secondary | ICD-10-CM

## 2017-07-25 LAB — COMPREHENSIVE METABOLIC PANEL
ALK PHOS: 47 U/L (ref 39–117)
ALT: 24 U/L (ref 0–53)
AST: 26 U/L (ref 0–37)
Albumin: 4.6 g/dL (ref 3.5–5.2)
BUN: 20 mg/dL (ref 6–23)
CO2: 26 mEq/L (ref 19–32)
Calcium: 9.7 mg/dL (ref 8.4–10.5)
Chloride: 103 mEq/L (ref 96–112)
Creatinine, Ser: 1.16 mg/dL (ref 0.40–1.50)
GFR: 67.89 mL/min (ref >60.00)
GLUCOSE: 91 mg/dL (ref 70–99)
POTASSIUM: 4.4 meq/L (ref 3.5–5.1)
Sodium: 139 mEq/L (ref 135–145)
TOTAL PROTEIN: 7.1 g/dL (ref 6.0–8.3)
Total Bilirubin: 0.6 mg/dL (ref 0.2–1.2)

## 2017-07-25 LAB — CBC
HCT: 42.6 % (ref 39.0–52.0)
HEMOGLOBIN: 14.8 g/dL (ref 13.0–17.0)
MCHC: 34.7 g/dL (ref 30.0–36.0)
MCV: 96.1 fl (ref 78.0–100.0)
PLATELETS: 259 10*3/uL (ref 150.0–400.0)
RBC: 4.44 Mil/uL (ref 4.22–5.81)
RDW: 13.2 % (ref 11.5–15.5)
WBC: 6.5 10*3/uL (ref 4.0–10.5)

## 2017-07-25 LAB — PSA: PSA: 0.44 ng/mL (ref 0.10–4.00)

## 2017-07-25 LAB — LIPID PANEL
CHOL/HDL RATIO: 3
CHOLESTEROL: 205 mg/dL — AB (ref 0–200)
HDL: 70.3 mg/dL (ref >39.00)
LDL CALC: 97 mg/dL (ref 0–99)
NonHDL: 134.64
Triglycerides: 186 mg/dL — ABNORMAL HIGH (ref 0.0–149.0)
VLDL: 37.2 mg/dL (ref 0.0–40.0)

## 2017-07-25 NOTE — Telephone Encounter (Signed)
Paperwork: CPE form for employer    Paperwork received by [employer] requesting form]:    Individual made aware of 3-5 business day turn around (Y/N): Y  Office form(s) completed and placed with paperwork (Y/N): Y  Form location:   [employer] Pick up folder

## 2017-07-26 NOTE — Telephone Encounter (Signed)
Form has been filled out and signed.  Placed at front for pick up.  Patient notified.

## 2017-10-03 NOTE — Progress Notes (Signed)
[facility] [place], [place] [address] Phone: [phone] Subjective:    I'm seeing this patient by the request  of:   Vivi Barrack, MD   CC: Right shoulder, right thumb pain  OFB:[id number]  Randall Estes is a 62 y.o. male coming in with complaint of right [facility] joint pain. Pain intermittently for a few months. Is stretching a fabric by hand. Pain is not becoming constant.   Patient also complains of right shoulder pain. He has had cortisone injection in that shoulder at [facility]. Patient is feeling clunky with flexion and has soreness over biceps insertion.     Past Medical History:  Diagnosis Date  . Coronary artery disease   . Hyperlipidemia   . Hypertension   . Old MI (myocardial infarction) 2009   Past Surgical History:  Procedure Laterality Date  . CORONARY ANGIOPLASTY    . DENTAL SURGERY    . NASAL SINUS SURGERY    . VARICOSE VEIN SURGERY     Social History   Socioeconomic History  . Marital status: Married    Spouse name: Not on file  . Number of children: Not on file  . Years of education: Not on file  . Highest education level: Not on file  Occupational History  . Not on file  Social Needs  . Financial resource strain: Not on file  . Food insecurity:    Worry: Not on file    Inability: Not on file  . Transportation needs:    Medical: Not on file    Non-medical: Not on file  Tobacco Use  . Smoking status: Former Research scientist (life sciences)  . Smokeless tobacco: Never Used  . Tobacco comment: 2009  Substance and Sexual Activity  . Alcohol use: Yes    Comment: 14  . Drug use: Never  . Sexual activity: Yes    Partners: Female  Lifestyle  . Physical activity:    Days per week: Not on file    Minutes per session: Not on file  . Stress: Not on file  Relationships  . Social connections:    Talks on phone: Not on file    Gets together: Not on file    Attends religious service: Not on file    Active member of club or  organization: Not on file    Attends meetings of clubs or organizations: Not on file    Relationship status: Not on file  Other Topics Concern  . Not on file  Social History Narrative  . Not on file   No Known Allergies Family History  Problem Relation Age of Onset  . Prostate cancer Father   . Cancer Paternal Grandmother   . Heart disease Neg Hx      Past medical history, social, surgical and family history all reviewed in electronic medical record.  No pertanent information unless stated regarding to the chief complaint.   Review of Systems:Review of systems updated and as accurate as of 10/04/17  No headache, visual changes, nausea, vomiting, diarrhea, constipation, dizziness, abdominal pain, skin rash, fevers, chills, night sweats, weight loss, swollen lymph nodes, body aches, joint swelling, , chest pain, shortness of breath, mood changes.  Positive muscle aches  Objective  Blood pressure 128/80, pulse 77, height 6\' 1"  (1.854 m), weight 248 lb (112.5 kg), SpO2 97 %. Systems examined below as of 10/04/17   General: No apparent distress alert and oriented x3 mood and affect normal, dressed appropriately.  HEENT: Pupils  equal, extraocular movements intact  Respiratory: Patient's speak in full sentences and does not appear short of breath  Cardiovascular: No lower extremity edema, non tender, no erythema  Skin: Warm dry intact with no signs of infection or rash on extremities or on axial skeleton.  Abdomen: Soft nontender  Neuro: Cranial nerves II through XII are intact, neurovascularly intact in all extremities with 2+ DTRs and 2+ pulses.  Lymph: No lymphadenopathy of posterior or anterior cervical chain or axillae bilaterally.  Gait normal with good balance and coordination.  MSK:  Non tender with full range of motion and good stability and symmetric strength and tone of  elbows,  hip, knee and ankles bilaterally.   Right wrist exam shows that patient does have some mild  swelling over the [facility] joint.  Patient does have a mild positive grind test.  Full strength no noted.  Deep tendon reflexes intact.  No pain over the scaphoid.   Shoulder: Right Inspection reveals no abnormalities, atrophy or asymmetry. Palpation is normal with no tenderness over AC joint or bicipital groove. ROM is restricted lacking the last 5 degrees of forward flexion, lacks 10 degrees of external rotation and only has internal rotation to sacrum Rotator cuff strength 4+ out of 5 Moderate impingement Speeds and Yergason's tests normal. No labral pathology noted with negative Obrien's, negative clunk and good stability. Normal scapular function observed. Mild painful arc No apprehension sign  MSK US performed of: Right This study was ordered, performed, and interpreted by Charlann Boxer D.O.  Shoulder:   Supraspinatus: Mild degenerative changes noted bursal bulge seen with shoulder abduction on impingement view. Subscapularis:  Appears normal on long and transverse views. Positive bursa Teres Minor:  Appears normal on long and transverse views. AC joint: Capsular distention Glenohumeral Joint: Moderate changes. Glenoid Labrum: Mild posterior changes noted Biceps Tendon:  Appears normal on long and transverse views, no fraying of tendon, tendon located in intertubercular groove, no subluxation with shoulder internal or external rotation.  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  Verbal informed consent obtained.  Time-out conducted.  Noted no overlying erythema, induration, or other signs of local infection.  Skin prepped in a  sterile fashion.  Local anesthesia: Topical Ethyl chloride.  With sterile technique and under real time ultrasound guidance:  Joint visualized.  23g 1  inch needle inserted posterior approach. Pictures taken for needle placement. Patient did have injection of 2 cc of 1% lidocaine, 2 cc of 0.5% Marcaine, and 1.0 cc of Kenalog 40 mg/dL. Completed without difficulty  Pain immediately resolved suggesting accurate placement of the medication.  Advised to call if fevers/chills, erythema, induration, drainage, or persistent bleeding.  Images permanently stored and available for review in the ultrasound unit.  Impression: Technically successful ultrasound guided injection.    [id number]; 15 additional minutes spent for Therapeutic exercises as stated in above notes.  This included exercises focusing on stretching, strengthening, with significant focus on eccentric aspects.   Long term goals include an improvement in range of motion, strength, endurance as well as avoiding reinjury. Patient's frequency would include in 1-2 times a day, 3-5 times a week for a duration of 6-12 weeks.  Shoulder Exercises that included:  Basic scapular stabilization to include  adduction and depression of scapula Scaption, focusing on proper movement and good control Internal and External rotation utilizing a theraband, with elbow tucked at side entire time Rows with theraband which was given  Proper technique shown and discussed handout in great detail with ATC.  All questions were discussed and answered.   Impression and Recommendations:     This case required medical decision making of moderate complexity.      Note: This dictation was prepared with Dragon dictation along with smaller phrase technology. Any transcriptional errors that result from this process are unintentional.

## 2017-10-04 ENCOUNTER — Ambulatory Visit: Payer: Managed Care, Other (non HMO) | Admitting: Family Medicine

## 2017-10-04 ENCOUNTER — Encounter: Payer: Self-pay | Admitting: Family Medicine

## 2017-10-04 ENCOUNTER — Ambulatory Visit (INDEPENDENT_AMBULATORY_CARE_PROVIDER_SITE_OTHER)
Admission: RE | Admit: 2017-10-04 | Discharge: 2017-10-04 | Disposition: A | Discharge status: Home / Self Care | Payer: Managed Care, Other (non HMO) | Source: Ambulatory Visit | Attending: Family Medicine | Admitting: Family Medicine

## 2017-10-04 ENCOUNTER — Ambulatory Visit: Payer: Self-pay

## 2017-10-04 VITALS — BP 128/80 | HR 77 | Ht 73.0 in | Wt 248.0 lb

## 2017-10-04 DIAGNOSIS — G8929 Other chronic pain: Secondary | ICD-10-CM

## 2017-10-04 DIAGNOSIS — M79644 Pain in right finger(s): Principal | ICD-10-CM

## 2017-10-04 DIAGNOSIS — M12811 Other specific arthropathies, not elsewhere classified, right shoulder: Secondary | ICD-10-CM

## 2017-10-04 DIAGNOSIS — M1811 Unilateral primary osteoarthritis of first carpometacarpal joint, right hand: Secondary | ICD-10-CM

## 2017-10-04 NOTE — Assessment & Plan Note (Signed)
CMC arthritis.  Discussed icing regimen, discussed bracing at night, topical anti-inflammatories, home exercise, which activities of doing which wants to avoid.  Follow-up again in 4 weeks

## 2017-10-04 NOTE — Patient Instructions (Signed)
Good to see you  Ice is your friend Exercises 3 times a week.  Keep hands within peripheral vision  pennsaid pinkie amount topically 2 times daily as needed.  INjected the shoulder today to cam it down  See em again in 4-6 weeks to make sure you are doing better

## 2017-10-04 NOTE — Assessment & Plan Note (Addendum)
New Problem.  Patient given injection.  Tolerated the procedure well.  Discussed icing regimen and topical anti-inflammatories.  Discussed which activities of doing which wants to avoid.  Patient will follow-up with me again in 4 to 6 weeks

## 2017-10-07 ENCOUNTER — Ambulatory Visit: Payer: Managed Care, Other (non HMO) | Admitting: Family Medicine

## 2017-10-07 ENCOUNTER — Encounter: Payer: Self-pay | Admitting: Family Medicine

## 2017-10-07 VITALS — BP 130/82 | HR 61 | Temp 98.1°F | Ht 73.0 in | Wt 245.4 lb

## 2017-10-07 DIAGNOSIS — H9192 Unspecified hearing loss, left ear: Secondary | ICD-10-CM | POA: Diagnosis not present

## 2017-10-07 DIAGNOSIS — R42 Dizziness and giddiness: Secondary | ICD-10-CM | POA: Diagnosis not present

## 2017-10-07 MED ORDER — ONDANSETRON 8 MG PO TBDP: 8.0000 mg | ORAL_TABLET | Freq: Three times a day (TID) | ORAL | 0 refills | Status: DC | PRN

## 2017-10-07 MED ORDER — MECLIZINE HCL 25 MG PO TABS: 25.0000 mg | ORAL_TABLET | Freq: Three times a day (TID) | ORAL | 0 refills | Status: DC | PRN

## 2017-10-07 NOTE — Progress Notes (Signed)
   Subjective:  Randall Estes is a 62 y.o. male who presents today for same-day appointment with a chief complaint of dizziness.   HPI:  Dizziness, Acute problem Had an episode of room spinning 2 days ago while watching TV.  Patient just eaten out at a restaurant about an hour or 2 before the symptoms started.  His symptoms lasted for about 4 hours and then subsided.  Associated with several episodes of vomiting.  He had a similar episode 4 weeks ago that also lasted for about 4 hours.  He had some associated left-sided hearing loss.  No other symptoms.  No weakness or numbness.  No chest pain.  No palpitations.  No dysarthria.  No vision changes.  ROS: Per HPI  PMH: He reports that he has quit smoking. He has never used smokeless tobacco. He reports that he drinks alcohol. He reports that he does not use drugs.  Objective:  Physical Exam: BP 130/82 (BP Location: Left Arm, Patient Position: Sitting, Cuff Size: Normal)   Pulse 61   Temp 98.1 F (36.7 C) (Oral)   Ht 6\' 1"  (1.854 m)   Wt 245 lb 6.4 oz (111.3 kg)   SpO2 96%   BMI 32.38 kg/m   Gen: NAD, resting comfortably HEENT: TMs clear bilaterally. CV: RRR with no murmurs appreciated Pulm: NWOB, CTAB with no crackles, wheezes, or rhonchi MSK: No edema, cyanosis, or clubbing noted Skin: Warm, dry Neuro: Cranial nerves II through XII intact.  Strength 5 out of 5 in lower and upper extremities.  Reflexes 2+ symmetric bilaterally.  Finger-nose-finger testing normal bilaterally. Psych: Normal affect and thought content  Patient with decreased hearing on left via bedside AOE testing  Assessment/Plan:  Vertigo No red flag signs or symptoms.  His neurological exam today is normal aside from decreased hearing on his left side.  History is not consistent with more serious etiologies such as tumor or stroke given that symptoms only lasted for a few hours and then subsided.  It is very possible he could have Mnire's disease given his  constellation of hearing loss and recurrent vertigo attacks.  Discussed further testing with patient including audiology referral and brain MRI to rule out anatomic abnormalities however patient declined.  We will send in prescription for Zofran for repeated episodes of vertigo.  Also start meclizine as needed.  Discussed Epley maneuvers and handout was given.  Discussed reasons to return to care.  Follow-up as needed.  Algis Greenhouse. Jerline Pain, MD 10/07/2017 10:49 AM

## 2017-10-07 NOTE — Patient Instructions (Signed)
It was very nice to see you today!  I think you have benign vertigo. It is also possible that you have a condition called Mnire's disease.  Please let me know if you are interested in further testing for this condition and to rule out other causes.  Please take the meclizine as needed for vertigo.  Please take Zofran as needed for nausea.  Please work on the Psychiatric nurse.  Let me know if your symptoms worsen or do not improve.  Take care, Dr Jerline Pain   Meniere Disease Meniere disease is an inner ear disorder. It causes attacks of a spinning sensation (vertigo), dizziness, and ringing in the ear (tinnitus). It also causes hearing loss and a feeling of fullness or pressure in the ear. This is a lifelong condition, and it may get worse over time. You may have drop attacks or severe dizziness that makes you fall. A drop attack is when you suddenly fall without losing consciousness and you quickly recover after a few seconds or minutes. What are the causes? This condition is caused by having too much of the fluid that is in your inner ear (endolymph). When fluid builds up in your inner ear, it affects the nerves that control balance and hearing. The reason for the fluid buildup is not known. Possible causes include:  Allergies.  An abnormal reaction of the body's defense system (autoimmune disease).  Viral infection of the inner ear.  Head injury.  What increases the risk? You are more likely to develop this condition if:  You are older than age 55.  You have a family history of Meniere disease.  You have a history of autoimmune disease.  You have a history of migraine headaches.  What are the signs or symptoms? Symptoms of this condition can come and go and may last for up to 4 hours at a time. Symptoms usually start in one ear. They may become more frequent and eventually involve both ears. Symptoms can include:  Fullness and pressure in your ear.  Roaring or ringing in your  ear.  Vertigo and loss of balance.  Dizziness.  Decreased hearing.  Nausea and vomiting.  How is this diagnosed? This condition is diagnosed based on:  A physical exam.  Tests , such as: ? A hearing test (audiogram). ? An electronystagmogram. This tests your balance nerve (vestibular nerve). ? Imaging studies of your inner ear, such as CT scan or MRI. ? Other balance tests, such as rotational or balance platform tests.  How is this treated? There is no cure for this condition, but treatment can help to manage your symptoms. Treatment may include:  A low-salt diet. Limiting salt may help to reduce fluid in the body and relieve symptoms.  Oral or injected medicines to reduce or control: ? Vertigo. ? Nausea. ? Fluid retention. ? Dizziness.  Use of an air pressure pulse generator. This is a machine that sends small pressure pulses into your ear canal.  Hearing aids.  Inner ear surgery. This is rare.  When you have symptoms, it can be helpful to lie down on a flat surface and focus your eyes on one object that does not move. Try to stay in that position until your symptoms go away. Follow these instructions at home: Eating and drinking  Eat the same amount of food at the same time every day, including snacks.  Do not skip meals.  Avoid caffeine.  Drink enough fluids to keep your urine clear or pale yellow.  Limit alcoholic drinks to one drink a day for non-pregnant women and 2 drinks a day for men. One drink equals 12 oz of beer, 5 oz of wine, or 1 oz of hard liquor.  Limit the salt (sodium) in your diet as told by your health care provider. Check ingredients and nutrition facts on packaged foods and beverages.  Do not eat foods that contain monosodium glutamate (MSG). General instructions  Do not use any products that contain nicotine or tobacco, such as cigarettes and e-cigarettes. If you need help quitting, ask your health care provider.  Take  over-the-counter and prescription medicines only as told by your health care provider.  Find ways to reduce or avoid stress. If you need help with this, ask your health care provider.  Do not drive if you have vertigo or dizziness. Contact a health care provider if:  You have symptoms that last longer than 4 hours.  You have new or worse symptoms. Get help right away if:  You have been vomiting for 24 hours.  You cannot keep fluids down.  You have chest pain or trouble breathing. Summary  Meniere disease is an inner ear disorder. It causes attacks of a spinning sensation (vertigo), dizziness, and ringing in the ear (tinnitus). It also causes hearing loss and a feeling of fullness or pressure in the ear.  Symptoms of this condition can come and go and may last for up to 4 hours at a time.  When you have symptoms, it can be helpful to lie down on a flat surface and focus your eyes on one object that does not move. Try to stay in that position until your symptoms go away. This information is not intended to replace advice given to you by your health care provider. Make sure you discuss any questions you have with your health care provider. Document Released: 01/27/2000 Document Revised: 12/21/2015 Document Reviewed: 12/21/2015 Elsevier Interactive Patient Education  2017 [employer].  How to Perform the Epley Maneuver The Epley maneuver is an exercise that relieves symptoms of vertigo. Vertigo is the feeling that you or your surroundings are moving when they are not. When you feel vertigo, you may feel like the room is spinning and have trouble walking. Dizziness is a little different than vertigo. When you are dizzy, you may feel unsteady or light-headed. You can do this maneuver at home whenever you have symptoms of vertigo. You can do it up to 3 times a day until your symptoms go away. Even though the Epley maneuver may relieve your vertigo for a few weeks, it is possible that your  symptoms will return. This maneuver relieves vertigo, but it does not relieve dizziness. What are the risks? If it is done correctly, the Epley maneuver is considered safe. Sometimes it can lead to dizziness or nausea that goes away after a short time. If you develop other symptoms, such as changes in vision, weakness, or numbness, stop doing the maneuver and call your health care provider. How to perform the Epley maneuver 1. Sit on the edge of a bed or table with your back straight and your legs extended or hanging over the edge of the bed or table. 2. Turn your head halfway toward the affected ear or side. 3. Lie backward quickly with your head turned until you are lying flat on your back. You may want to position a pillow under your shoulders. 4. Hold this position for 30 seconds. You may experience an attack of vertigo. This  is normal. 5. Turn your head to the opposite direction until your unaffected ear is facing the floor. 6. Hold this position for 30 seconds. You may experience an attack of vertigo. This is normal. Hold this position until the vertigo stops. 7. Turn your whole body to the same side as your head. Hold for another 30 seconds. 8. Sit back up. You can repeat this exercise up to 3 times a day. Follow these instructions at home:  After doing the Epley maneuver, you can return to your normal activities.  Ask your health care provider if there is anything you should do at home to prevent vertigo. He or she may recommend that you: ? Keep your head raised (elevated) with two or more pillows while you sleep. ? Do not sleep on the side of your affected ear. ? Get up slowly from bed. ? Avoid sudden movements during the day. ? Avoid extreme head movement, like looking up or bending over. Contact a health care provider if:  Your vertigo gets worse.  You have other symptoms, including: ? Nausea. ? Vomiting. ? Headache. Get help right away if:  You have vision changes.  You  have a severe or worsening headache or neck pain.  You cannot stop vomiting.  You have new numbness or weakness in any part of your body. Summary  Vertigo is the feeling that you or your surroundings are moving when they are not.  The Epley maneuver is an exercise that relieves symptoms of vertigo.  If the Epley maneuver is done correctly, it is considered safe. You can do it up to 3 times a day. This information is not intended to replace advice given to you by your health care provider. Make sure you discuss any questions you have with your health care provider. Document Released: 02/03/2013 Document Revised: 12/20/2015 Document Reviewed: 12/20/2015 Elsevier Interactive Patient Education  2017 [employer].

## 2017-11-01 NOTE — Progress Notes (Signed)
[facility], [place] [address] Phone: [phone] Subjective:    I [name] am serving as a Education administrator for Dr. Hulan Saas.      CC: Bilateral thumb pain  UMP:[id number]  Randall Estes is a 62 y.o. male coming in with complaint of right thumb pain. States that his thumb is feeling about the same. Shoulder is doing better.  Patient was given in the shoulder injection previously.  Doing much better.  Describes though the thumbs bilaterally giving more trouble.  Does do a lot of manual labor at work.  Has to use his thumbs.  Starting given aching dull, throbbing aching sensation at all times though.  Sometimes can wake him up at night.      Past Medical History:  Diagnosis Date  . Coronary artery disease   . Hyperlipidemia   . Hypertension   . Old MI (myocardial infarction) 2009   Past Surgical History:  Procedure Laterality Date  . CORONARY ANGIOPLASTY    . DENTAL SURGERY    . NASAL SINUS SURGERY    . VARICOSE VEIN SURGERY     Social History   Socioeconomic History  . Marital status: Married    Spouse name: Not on file  . Number of children: Not on file  . Years of education: Not on file  . Highest education level: Not on file  Occupational History  . Not on file  Social Needs  . Financial resource strain: Not on file  . Food insecurity:    Worry: Not on file    Inability: Not on file  . Transportation needs:    Medical: Not on file    Non-medical: Not on file  Tobacco Use  . Smoking status: Former Research scientist (life sciences)  . Smokeless tobacco: Never Used  . Tobacco comment: 2009  Substance and Sexual Activity  . Alcohol use: Yes    Comment: 14  . Drug use: Never  . Sexual activity: Yes    Partners: Female  Lifestyle  . Physical activity:    Days per week: Not on file    Minutes per session: Not on file  . Stress: Not on file  Relationships  . Social connections:    Talks on phone: Not on file    Gets together:  Not on file    Attends religious service: Not on file    Active member of club or organization: Not on file    Attends meetings of clubs or organizations: Not on file    Relationship status: Not on file  Other Topics Concern  . Not on file  Social History Narrative  . Not on file   No Known Allergies Family History  Problem Relation Age of Onset  . Prostate cancer Father   . Cancer Paternal Grandmother   . Heart disease Neg Hx      Current Outpatient Medications (Cardiovascular):  .  atorvastatin (LIPITOR) 40 MG tablet, TAKE 1 TABLET (40 MG TOTAL) BY MOUTH DAILY. Marland Kitchen  lisinopril (PRINIVIL,ZESTRIL) 10 MG tablet, TAKE 0.5 TABLETS (5 MG TOTAL) BY MOUTH DAILY.   Current Outpatient Medications (Analgesics):  .  aspirin 81 MG tablet, Take 1 tablet (81 mg total) by mouth daily.   Current Outpatient Medications (Other):  Marland Kitchen  Coenzyme Q10 (COQ10) 100 MG CAPS, Take 200 mg by mouth daily. .  meclizine (ANTIVERT) 25 MG tablet, Take 1 tablet (25 mg total) by mouth 3 (three) times daily as needed for dizziness. Marland Kitchen  ondansetron (ZOFRAN ODT) 8 MG disintegrating tablet, Take 1 tablet (8 mg total) by mouth every 8 (eight) hours as needed for nausea or vomiting.    Past medical history, social, surgical and family history all reviewed in electronic medical record.  No pertanent information unless stated regarding to the chief complaint.   Review of Systems:  No headache, visual changes, nausea, vomiting, diarrhea, constipation, dizziness, abdominal pain, skin rash, fevers, chills, night sweats, weight loss, swollen lymph nodes, body aches, joint swelling, muscle aches, chest pain, shortness of breath, mood changes.  Positive muscle aches, joint swelling, body aches  Objective  Blood pressure 140/82, pulse 72, height 6\' 1"  (1.854 m), weight 249 lb (112.9 kg), SpO2 96 %.   General: No apparent distress alert and oriented x3 mood and affect normal, dressed appropriately.  HEENT: Pupils equal,  extraocular movements intact  Respiratory: Patient's speak in full sentences and does not appear short of breath  Cardiovascular: No lower extremity edema, non tender, no erythema  Skin: Warm dry intact with no signs of infection or rash on extremities or on axial skeleton.  Abdomen: Soft nontender  Neuro: Cranial nerves II through XII are intact, neurovascularly intact in all extremities with 2+ DTRs and 2+ pulses.  Lymph: No lymphadenopathy of posterior or anterior cervical chain or axillae bilaterally.  Gait normal with good balance and coordination.  MSK:  Non tender with full range of motion and good stability and symmetric strength and tone of shoulders, elbows, wrist, hip, knee and ankles bilaterally.  Shoulder exam still shows some mild arthritic changes but no pain.  4+ out of 5 strength but symmetric Skin exam bilaterally shows a positive CMC grind.  Patient does have arthritic changes.  No loss of the thenar eminence.  Full range of motion though otherwise.  Procedure: Real-time Ultrasound Guided Injection of right CMC joint Device: GE Logiq Q7 Ultrasound guided injection is preferred based studies that show increased duration, increased effect, greater accuracy, decreased procedural pain, increased response rate, and decreased cost with ultrasound guided versus blind injection.  Verbal informed consent obtained.  Time-out conducted.  Noted no overlying erythema, induration, or other signs of local infection.  Skin prepped in a sterile fashion.  Local anesthesia: Topical Ethyl chloride.  With sterile technique and under real time ultrasound guidance: With a 25-gauge half inch needle injected with 0.5 cc of 0.5% Marcaine and 0.5 cc of Kenalog 40 mg/mL into the [facility] joint Completed without difficulty  Pain immediately resolved suggesting accurate placement of the medication.  Advised to call if fevers/chills, erythema, induration, drainage, or persistent bleeding.  Images permanently  stored and available for review in the ultrasound unit.  Impression: Technically successful ultrasound guided injection.  Procedure: Real-time Ultrasound Guided Injection of left CMC joint Device: GE Logiq Q7 Ultrasound guided injection is preferred based studies that show increased duration, increased effect, greater accuracy, decreased procedural pain, increased response rate, and decreased cost with ultrasound guided versus blind injection.  Verbal informed consent obtained.  Time-out conducted.  Noted no overlying erythema, induration, or other signs of local infection.  Skin prepped in a sterile fashion.  Local anesthesia: Topical Ethyl chloride.  With sterile technique and under real time ultrasound guidance: With a 25-gauge half inch needle patient was injected with 0.5 cc of 0.5% Marcaine and 0.5 cc of Kenalog 40 mg/mL the left CMC joint Completed without difficulty  Pain immediately resolved suggesting accurate placement of the medication.  Advised to call if fevers/chills, erythema, induration, drainage,  or persistent bleeding.  Images permanently stored and available for review in the ultrasound unit.  Impression: Technically successful ultrasound guided injection.   Impression and Recommendations:     This case required medical decision making of moderate complexity. The above documentation has been reviewed and is accurate and complete [name], DO       Note: This dictation was prepared with Dragon dictation along with smaller phrase technology. Any transcriptional errors that result from this process are unintentional.

## 2017-11-04 ENCOUNTER — Ambulatory Visit: Payer: Managed Care, Other (non HMO) | Admitting: Family Medicine

## 2017-11-04 ENCOUNTER — Ambulatory Visit: Payer: Self-pay

## 2017-11-04 ENCOUNTER — Encounter: Payer: Self-pay | Admitting: Family Medicine

## 2017-11-04 VITALS — BP 140/82 | HR 72 | Ht 73.0 in | Wt 249.0 lb

## 2017-11-04 DIAGNOSIS — G8929 Other chronic pain: Secondary | ICD-10-CM

## 2017-11-04 DIAGNOSIS — M1812 Unilateral primary osteoarthritis of first carpometacarpal joint, left hand: Secondary | ICD-10-CM | POA: Diagnosis not present

## 2017-11-04 DIAGNOSIS — M79644 Pain in right finger(s): Secondary | ICD-10-CM

## 2017-11-04 DIAGNOSIS — M1811 Unilateral primary osteoarthritis of first carpometacarpal joint, right hand: Secondary | ICD-10-CM

## 2017-11-04 DIAGNOSIS — M180 Bilateral primary osteoarthritis of first carpometacarpal joints: Secondary | ICD-10-CM | POA: Insufficient documentation

## 2017-11-04 NOTE — Patient Instructions (Signed)
Good to see you  Ice is your friend Be careful with driving.  You should do well  Make an appointment in 4 weeks just in case otherwise see me when you need me

## 2017-11-04 NOTE — Assessment & Plan Note (Signed)
Patient given injection today.  Tolerated the procedure well.  Discussed icing regimen and home exercise.  Discussed which activities to doing which wants to avoid.

## 2017-11-04 NOTE — Assessment & Plan Note (Signed)
New problem on the left side.  Discussed with patient in great length.  We discussed bracing, home exercise, icing regimen, discussed which activities to do which was to avoid.  Follow-up again 6 to 8 weeks.  Discussed bracing at night.

## 2017-11-18 ENCOUNTER — Ambulatory Visit: Payer: Managed Care, Other (non HMO) | Admitting: Family Medicine

## 2017-11-18 ENCOUNTER — Encounter: Payer: Self-pay | Admitting: Family Medicine

## 2017-11-18 VITALS — BP 136/78 | HR 84 | Temp 98.6°F | Ht 73.0 in | Wt 249.0 lb

## 2017-11-18 DIAGNOSIS — R42 Dizziness and giddiness: Secondary | ICD-10-CM

## 2017-11-18 DIAGNOSIS — Z23 Encounter for immunization: Secondary | ICD-10-CM

## 2017-11-18 NOTE — Assessment & Plan Note (Signed)
No red flag signs or symptoms.  As noted our last office visit.  It is possible he has Mnire's disease given his concurrent hearing loss.  Will place referral to ENT for further evaluation and management.  Continue meclizine and Zofran as needed for vertigo and nausea.  Discussed reasons to seek emergent care and return to care.

## 2017-11-18 NOTE — Patient Instructions (Signed)
It was very nice to see you today!  I will refer you to a ear nose and throat specialist for your vertigo.  They may need to do further testing.   Please take the meclizine and Zofran for signs of the vertigo attack.  Please come back to see me in June for your annual physical, or sooner as needed.  We will get your flu shot today.  Take care, Dr Jerline Pain

## 2017-11-18 NOTE — Progress Notes (Signed)
   Subjective:  Randall Estes is a 62 y.o. male who presents today with a chief complaint of vertigo follow up.   HPI:  Vertigo, chronic problem, uncontrolled Patient last seen about 5 weeks ago for this.  He has had a couple episodes of vertigo since then.  Pain episode last week that lasted for several hours with quite a bit of associated nausea and vomiting.  He has not had any symptoms for the past week or so.  He has been working on his Psychiatric nurse as well.  No reported weakness or numbness.  ROS: Per HPI  Objective:  Physical Exam: BP 136/78 (BP Location: Left Arm, Patient Position: Sitting, Cuff Size: Normal)   Pulse 84   Temp 98.6 F (37 C) (Oral)   Ht 6\' 1"  (1.854 m)   Wt 249 lb (112.9 kg)   SpO2 97%   BMI 32.85 kg/m   Gen: NAD, resting comfortably HEENT: TMs clear bilaterally. CV: RRR with no murmurs appreciated Pulm: NWOB, CTAB with no crackles, wheezes, or rhonchi Neuro: Cranial nerves II through XII intact.  Strength intact in upper and lower extremities.  No gross abnormalities.  Assessment/Plan:  Vertigo No red flag signs or symptoms.  As noted our last office visit.  It is possible he has Mnire's disease given his concurrent hearing loss.  Will place referral to ENT for further evaluation and management.  Continue meclizine and Zofran as needed for vertigo and nausea.  Discussed reasons to seek emergent care and return to care.  Preventative healthcare Flu shot given today.  Algis Greenhouse. Jerline Pain, MD 11/18/2017 10:12 AM

## 2017-11-19 ENCOUNTER — Encounter: Payer: Self-pay | Admitting: Family Medicine

## 2017-11-26 ENCOUNTER — Encounter: Payer: Self-pay | Admitting: Family Medicine

## 2017-11-26 ENCOUNTER — Ambulatory Visit: Payer: Managed Care, Other (non HMO) | Admitting: Family Medicine

## 2017-11-26 ENCOUNTER — Ambulatory Visit: Payer: Self-pay

## 2017-11-26 VITALS — BP 122/80 | HR 68 | Ht 73.0 in | Wt 247.0 lb

## 2017-11-26 DIAGNOSIS — M67951 Unspecified disorder of synovium and tendon, right thigh: Secondary | ICD-10-CM

## 2017-11-26 DIAGNOSIS — M6798 Unspecified disorder of synovium and tendon, other site: Secondary | ICD-10-CM

## 2017-11-26 DIAGNOSIS — M7918 Myalgia, other site: Secondary | ICD-10-CM

## 2017-11-26 NOTE — Patient Instructions (Addendum)
Great to see you  Its that time of year again!  Injected the glute.  You know the drill  Stay active See me again in 6ish weeks

## 2017-11-26 NOTE — Assessment & Plan Note (Signed)
Worsening symptoms, responded very well to injection previously.  Attempted again.  Discussed icing regimen and home exercise.  Discussed which activities to do which wants to avoid.  Increase activity slowly over the course the next several days.  Discussed which activities to avoid.  Follow-up with me again in 4 to 8 weeks.

## 2017-11-26 NOTE — Progress Notes (Signed)
[facility] [place], [place] [address] Phone: [phone] Subjective:    I Randall Estes am serving as a Education administrator for Dr. Hulan Saas.   I'm seeing this patient by the request  of:    CC: hip Pain follow-up  [id number]  Randall Estes is a 62 y.o. male coming in with complaint of hip pain. States today it is painful. Can't stretch it out. Was injected for the pain last visit. Would like an injection today. Thumbs are doing ok. Left still has some issues. Patient has had a gluteal tendinitis previously.  Last injection was in August 2018.  Did do very well for the gluteus medius injection.  Patient states that this is very similar symptoms.  Severe thumb pain noted.  Discussed patient has not been doing the exercises regularly.  Wondering to know if an injection could be beneficial.     Past Medical History:  Diagnosis Date  . Coronary artery disease   . Hyperlipidemia   . Hypertension   . Old MI (myocardial infarction) 2009   Past Surgical History:  Procedure Laterality Date  . CORONARY ANGIOPLASTY    . DENTAL SURGERY    . NASAL SINUS SURGERY    . VARICOSE VEIN SURGERY     Social History   Socioeconomic History  . Marital status: Married    Spouse name: Not on file  . Number of children: Not on file  . Years of education: Not on file  . Highest education level: Not on file  Occupational History  . Not on file  Social Needs  . Financial resource strain: Not on file  . Food insecurity:    Worry: Not on file    Inability: Not on file  . Transportation needs:    Medical: Not on file    Non-medical: Not on file  Tobacco Use  . Smoking status: Former Research scientist (life sciences)  . Smokeless tobacco: Never Used  . Tobacco comment: 2009  Substance and Sexual Activity  . Alcohol use: Yes    Comment: 14  . Drug use: Never  . Sexual activity: Yes    Partners: Female  Lifestyle  . Physical activity:    Days per week: Not on file   Minutes per session: Not on file  . Stress: Not on file  Relationships  . Social connections:    Talks on phone: Not on file    Gets together: Not on file    Attends religious service: Not on file    Active member of club or organization: Not on file    Attends meetings of clubs or organizations: Not on file    Relationship status: Not on file  Other Topics Concern  . Not on file  Social History Narrative  . Not on file   No Known Allergies Family History  Problem Relation Age of Onset  . Prostate cancer Father   . Cancer Paternal Grandmother   . Heart disease Neg Hx      Current Outpatient Medications (Cardiovascular):  .  atorvastatin (LIPITOR) 40 MG tablet, TAKE 1 TABLET (40 MG TOTAL) BY MOUTH DAILY. Marland Kitchen  lisinopril (PRINIVIL,ZESTRIL) 10 MG tablet, TAKE 0.5 TABLETS (5 MG TOTAL) BY MOUTH DAILY.   Current Outpatient Medications (Analgesics):  .  aspirin 81 MG tablet, Take 1 tablet (81 mg total) by mouth daily.   Current Outpatient Medications (Other):  Marland Kitchen  Coenzyme Q10 (COQ10) 100 MG CAPS, Take 200 mg by mouth daily. Marland Kitchen  meclizine (ANTIVERT) 25 MG tablet, Take 1 tablet (25 mg total) by mouth 3 (three) times daily as needed for dizziness. .  ondansetron (ZOFRAN ODT) 8 MG disintegrating tablet, Take 1 tablet (8 mg total) by mouth every 8 (eight) hours as needed for nausea or vomiting.    Past medical history, social, surgical and family history all reviewed in electronic medical record.  No pertanent information unless stated regarding to the chief complaint.   Review of Systems:  No headache, visual changes, nausea, vomiting, diarrhea, constipation, dizziness, abdominal pain, skin rash, fevers, chills, night sweats, weight loss, swollen lymph nodes, body aches, joint swelling,  chest pain, shortness of breath, mood changes.  Positive muscle aches   Objective  Blood pressure 122/80, pulse 68, height 6\' 1"  (1.854 m), weight 247 lb (112 kg), SpO2 96 %.    General: No  apparent distress alert and oriented x3 mood and affect normal, dressed appropriately.  HEENT: Pupils equal, extraocular movements intact  Respiratory: Patient's speak in full sentences and does not appear short of breath  Cardiovascular: No lower extremity edema, non tender, no erythema  Skin: Warm dry intact with no signs of infection or rash on extremities or on axial skeleton.  Abdomen: Soft nontender  Neuro: Cranial nerves II through XII are intact, neurovascularly intact in all extremities with 2+ DTRs and 2+ pulses.  Lymph: No lymphadenopathy of posterior or anterior cervical chain or axillae bilaterally.  Gait normal with good balance and coordination.  MSK:  Non tender with full range of motion and good stability and symmetric strength and tone of shoulders, elbows, wrist, , knee and ankles bilaterally.  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  Left greater than right.  Patient has good grip strength.  Right hip exam shows severe tightness with the [facility] test.  Pain over the gluteus medius tendon again.  Seems to be just lateral to the posterior aspect of the gluteal tendon itself.  Hip abductor strength 4 out of 5.  Negative straight leg test.  Minimal discomfort in the paraspinal musculature of the lumbar spine.  Procedure: Real-time Ultrasound Guided Injection of right gluteal tendon sheath Device: GE Logiq Q7 Ultrasound guided injection is preferred based studies that show increased duration, increased effect, greater accuracy, decreased procedural pain, increased response rate, and decreased cost with ultrasound guided versus blind injection.  Verbal informed consent obtained.  Time-out conducted.  Noted no overlying erythema, induration, or other signs of local infection.  Skin prepped in a sterile fashion.  Local anesthesia: Topical Ethyl chloride.  With sterile technique and under real time ultrasound guidance: With a 21-gauge 3 inch needle  patient was injected with 2 cc of 0.5% Marcaine and 1 cc of Kenalog 40 mg/mL Completed without difficulty  Pain immediately resolved suggesting accurate placement of the medication.  Advised to call if fevers/chills, erythema, induration, drainage, or persistent bleeding.  Images permanently stored and available for review in the ultrasound unit.  Impression: Technically successful ultrasound guided injection.    Impression and Recommendations:     This case required medical decision making of moderate complexity. The above documentation has been reviewed and is accurate and complete [name]       Note: This dictation was prepared with Dragon dictation along with smaller phrase technology. Any transcriptional errors that result from this process are unintentional.

## 2017-11-29 ENCOUNTER — Ambulatory Visit: Payer: Self-pay | Admitting: Family Medicine

## 2017-12-10 ENCOUNTER — Ambulatory Visit: Payer: Managed Care, Other (non HMO) | Admitting: Family Medicine

## 2018-01-05 NOTE — Progress Notes (Signed)
[facility] [place], [place] [address] Phone: [phone] Subjective:   Randall Estes, am serving as a scribe for Dr. Hulan Saas.   CC: Right thumb pain follow-up  SEG:[id number]  Randall Estes is a 62 y.o. male coming in with complaint of right thumb pain. Did have injection last visit. Had relief for 2 weeks. Patient still has pain with movement. Pain can be sharp but otherwise is dull.  Patient states it is not severe.  Patient states mild weakness with repetitive activity but nothing severe.    Past Medical History:  Diagnosis Date  . Coronary artery disease   . Hyperlipidemia   . Hypertension   . Old MI (myocardial infarction) 2009   Past Surgical History:  Procedure Laterality Date  . CORONARY ANGIOPLASTY    . DENTAL SURGERY    . NASAL SINUS SURGERY    . VARICOSE VEIN SURGERY     Social History   Socioeconomic History  . Marital status: Married    Spouse name: Not on file  . Number of children: Not on file  . Years of education: Not on file  . Highest education level: Not on file  Occupational History  . Not on file  Social Needs  . Financial resource strain: Not on file  . Food insecurity:    Worry: Not on file    Inability: Not on file  . Transportation needs:    Medical: Not on file    Non-medical: Not on file  Tobacco Use  . Smoking status: Former Research scientist (life sciences)  . Smokeless tobacco: Never Used  . Tobacco comment: 2009  Substance and Sexual Activity  . Alcohol use: Yes    Comment: 14  . Drug use: Never  . Sexual activity: Yes    Partners: Female  Lifestyle  . Physical activity:    Days per week: Not on file    Minutes per session: Not on file  . Stress: Not on file  Relationships  . Social connections:    Talks on phone: Not on file    Gets together: Not on file    Attends religious service: Not on file    Active member of club or organization: Not on file    Attends meetings of clubs or  organizations: Not on file    Relationship status: Not on file  Other Topics Concern  . Not on file  Social History Narrative  . Not on file   Estes Known Allergies Family History  Problem Relation Age of Onset  . Prostate cancer Father   . Cancer Paternal Grandmother   . Heart disease Neg Hx      Current Outpatient Medications (Cardiovascular):  .  atorvastatin (LIPITOR) 40 MG tablet, TAKE 1 TABLET (40 MG TOTAL) BY MOUTH DAILY. Marland Kitchen  lisinopril (PRINIVIL,ZESTRIL) 10 MG tablet, TAKE 0.5 TABLETS (5 MG TOTAL) BY MOUTH DAILY.   Current Outpatient Medications (Analgesics):  .  aspirin 81 MG tablet, Take 1 tablet (81 mg total) by mouth daily.   Current Outpatient Medications (Other):  Marland Kitchen  Coenzyme Q10 (COQ10) 100 MG CAPS, Take 200 mg by mouth daily. .  meclizine (ANTIVERT) 25 MG tablet, Take 1 tablet (25 mg total) by mouth 3 (three) times daily as needed for dizziness. .  minocycline (DYNACIN) 50 MG tablet, Take 50 mg by mouth 2 (two) times daily. .  ondansetron (ZOFRAN ODT) 8 MG disintegrating tablet, Take 1 tablet (8 mg total) by mouth every 8 (  eight) hours as needed for nausea or vomiting.    Past medical history, social, surgical and family history all reviewed in electronic medical record.  Estes pertanent information unless stated regarding to the chief complaint.   Review of Systems:  Estes headache, visual changes, nausea, vomiting, diarrhea, constipation, dizziness, abdominal pain, skin rash, fevers, chills, night sweats, weight loss, swollen lymph nodes, body aches, joint swelling, muscle aches, chest pain, shortness of breath, mood changes.   Objective  Blood pressure 138/86, pulse 73, height 6\' 1"  (1.854 m), weight 256 lb (116.1 kg), SpO2 97 %.    General: Estes apparent distress alert and oriented x3 mood and affect normal, dressed appropriately.  HEENT: Pupils equal, extraocular movements intact  Respiratory: Patient's speak in full sentences and does not appear short of breath    Cardiovascular: Estes lower extremity edema, non tender, Estes erythema  Skin: Warm dry intact with Estes signs of infection or rash on extremities or on axial skeleton.  Abdomen: Soft nontender  Neuro: Cranial nerves II through XII are intact, neurovascularly intact in all extremities with 2+ DTRs and 2+ pulses.  Lymph: Estes lymphadenopathy of posterior or anterior cervical chain or axillae bilaterally.  Gait normal with good balance and coordination.  MSK:  Non tender with full range of motion and good stability and symmetric strength and tone of shoulders, elbows,  hip, knee and ankles bilaterally.  Right CMC joint does have a positive grind test.  Estes significant weakness noted.  Neurovascular intact.  Good capillary refill.  Negative Tinel's.    Impression and Recommendations:      The above documentation has been reviewed and is accurate and complete Randall Pulley, DO       Note: This dictation was prepared with Dragon dictation along with smaller phrase technology. Any transcriptional errors that result from this process are unintentional.

## 2018-01-06 ENCOUNTER — Ambulatory Visit: Payer: Managed Care, Other (non HMO) | Admitting: Family Medicine

## 2018-01-06 ENCOUNTER — Ambulatory Visit: Payer: Self-pay

## 2018-01-06 ENCOUNTER — Encounter: Payer: Self-pay | Admitting: Family Medicine

## 2018-01-06 VITALS — BP 138/86 | HR 73 | Ht 73.0 in | Wt 256.0 lb

## 2018-01-06 DIAGNOSIS — G8929 Other chronic pain: Secondary | ICD-10-CM

## 2018-01-06 DIAGNOSIS — M79644 Pain in right finger(s): Secondary | ICD-10-CM

## 2018-01-06 DIAGNOSIS — M1811 Unilateral primary osteoarthritis of first carpometacarpal joint, right hand: Secondary | ICD-10-CM | POA: Diagnosis not present

## 2018-01-06 NOTE — Assessment & Plan Note (Signed)
Stable.  Discussed icing regimen and home exercises.  Discussed the possibility of a brace which patient declined.  Will hold on any type of repeating injection at the moment.  Follow-up again in 4 weeks

## 2018-01-06 NOTE — Patient Instructions (Signed)
Good to see you  Ice is your friend Try the pennsaid on the thumb 2 times a day  Try to avoid heavy lifting when possible If gets worse take the prednisone  Otherwise see me again in 6 weeks and we can repeat injection at that time

## 2018-02-02 NOTE — Progress Notes (Signed)
[facility] [place], [place] [address] Phone: [phone] Subjective:   Randall Estes, am serving as a scribe for Dr. Hulan Saas.    CC: Right-sided gluteal pain  [id number]  Randall Estes is a 62 y.o. male coming in with complaint of right sided glute pain. Was seen in October for similar pain. Patient is looking to get another cortisone injection in the glute. Has had injection which helped. Has good days and bad days. Pain does not radiate.  Had near complete resolution of pain before with the last injection.  Patient states that his thumb and wrist pain have subsided since last visit.  Near 100% of the moment       Past Medical History:  Diagnosis Date  . Coronary artery disease   . Hyperlipidemia   . Hypertension   . Old MI (myocardial infarction) 2009   Past Surgical History:  Procedure Laterality Date  . CORONARY ANGIOPLASTY    . DENTAL SURGERY    . NASAL SINUS SURGERY    . VARICOSE VEIN SURGERY     Social History   Socioeconomic History  . Marital status: Married    Spouse name: Not on file  . Number of children: Not on file  . Years of education: Not on file  . Highest education level: Not on file  Occupational History  . Not on file  Social Needs  . Financial resource strain: Not on file  . Food insecurity:    Worry: Not on file    Inability: Not on file  . Transportation needs:    Medical: Not on file    Non-medical: Not on file  Tobacco Use  . Smoking status: Former Research scientist (life sciences)  . Smokeless tobacco: Never Used  . Tobacco comment: 2009  Substance and Sexual Activity  . Alcohol use: Yes    Comment: 14  . Drug use: Never  . Sexual activity: Yes    Partners: Female  Lifestyle  . Physical activity:    Days per week: Not on file    Minutes per session: Not on file  . Stress: Not on file  Relationships  . Social connections:    Talks on phone: Not on file    Gets together: Not on file   Attends religious service: Not on file    Active member of club or organization: Not on file    Attends meetings of clubs or organizations: Not on file    Relationship status: Not on file  Other Topics Concern  . Not on file  Social History Narrative  . Not on file   Estes Known Allergies Family History  Problem Relation Age of Onset  . Prostate cancer Father   . Cancer Paternal Grandmother   . Heart disease Neg Hx      Current Outpatient Medications (Cardiovascular):  .  atorvastatin (LIPITOR) 40 MG tablet, TAKE 1 TABLET (40 MG TOTAL) BY MOUTH DAILY. Marland Kitchen  lisinopril (PRINIVIL,ZESTRIL) 10 MG tablet, TAKE 0.5 TABLETS (5 MG TOTAL) BY MOUTH DAILY.   Current Outpatient Medications (Analgesics):  .  aspirin 81 MG tablet, Take 1 tablet (81 mg total) by mouth daily.   Current Outpatient Medications (Other):  Marland Kitchen  Coenzyme Q10 (COQ10) 100 MG CAPS, Take 200 mg by mouth daily. .  meclizine (ANTIVERT) 25 MG tablet, Take 1 tablet (25 mg total) by mouth 3 (three) times daily as needed for dizziness. .  minocycline (DYNACIN) 50 MG tablet, Take 50 mg  by mouth 2 (two) times daily. .  ondansetron (ZOFRAN ODT) 8 MG disintegrating tablet, Take 1 tablet (8 mg total) by mouth every 8 (eight) hours as needed for nausea or vomiting.    Past medical history, social, surgical and family history all reviewed in electronic medical record.  Estes pertanent information unless stated regarding to the chief complaint.   Review of Systems:  Estes headache, visual changes, nausea, vomiting, diarrhea, constipation, dizziness, abdominal pain, skin rash, fevers, chills, night sweats, weight loss, swollen lymph nodes, body aches, joint swelling, muscle aches, chest pain, shortness of breath, mood changes.   Objective  Blood pressure 116/70, pulse 85, height 6\' 1"  (1.854 m), weight 256 lb (116.1 kg), SpO2 97 %.    General: Estes apparent distress alert and oriented x3 mood and affect normal, dressed appropriately.  HEENT:  Pupils equal, extraocular movements intact  Respiratory: Patient's speak in full sentences and does not appear short of breath  Cardiovascular: Estes lower extremity edema, non tender, Estes erythema  Skin: Warm dry intact with Estes signs of infection or rash on extremities or on axial skeleton.  Abdomen: Soft nontender  Neuro: Cranial nerves II through XII are intact, neurovascularly intact in all extremities with 2+ DTRs and 2+ pulses.  Lymph: Estes lymphadenopathy of posterior or anterior cervical chain or axillae bilaterally.  Gait normal with good balance and coordination.  MSK:  Non tender with full range of motion and good stability and symmetric strength and tone of shoulders, elbows, wrist,  knee and ankles bilaterally.  Right hip pain noted more in the gluteal region again.  Seems to be also over the piriformis.  Some mild tightness especially with Corky Sox test.  Procedure: Real-time Ultrasound Guided Injection of right gluteal tendon sheath Device: GE Logiq Q7 Ultrasound guided injection is preferred based studies that show increased duration, increased effect, greater accuracy, decreased procedural pain, increased response rate, and decreased cost with ultrasound guided versus blind injection.  Verbal informed consent obtained.  Time-out conducted.  Noted Estes overlying erythema, induration, or other signs of local infection.  Skin prepped in a sterile fashion.  Local anesthesia: Topical Ethyl chloride.  With sterile technique and under real time ultrasound guidance: With a 21-gauge 2 inch needle was injected with 0.5 cc of 0.5% Marcaine and 1 cc of Kenalog 40 mg/mL within the gluteal tendon sheath Completed without difficulty  Pain immediately resolved suggesting accurate placement of the medication.  Advised to call if fevers/chills, erythema, induration, drainage, or persistent bleeding.  Images permanently stored and available for review in the ultrasound unit.  Impression: Technically  successful ultrasound guided injection.    Impression and Recommendations:     This case required medical decision making of moderate complexity. The above documentation has been reviewed and is accurate and complete [name], DO       Note: This dictation was prepared with Dragon dictation along with smaller phrase technology. Any transcriptional errors that result from this process are unintentional.

## 2018-02-03 ENCOUNTER — Ambulatory Visit: Payer: Managed Care, Other (non HMO) | Admitting: Family Medicine

## 2018-02-03 ENCOUNTER — Ambulatory Visit: Payer: Self-pay

## 2018-02-03 ENCOUNTER — Encounter: Payer: Self-pay | Admitting: Family Medicine

## 2018-02-03 VITALS — BP 116/70 | HR 85 | Ht 73.0 in | Wt 256.0 lb

## 2018-02-03 DIAGNOSIS — M7601 Gluteal tendinitis, right hip: Secondary | ICD-10-CM | POA: Diagnosis not present

## 2018-02-03 DIAGNOSIS — M67951 Unspecified disorder of synovium and tendon, right thigh: Secondary | ICD-10-CM

## 2018-02-03 DIAGNOSIS — M6798 Unspecified disorder of synovium and tendon, other site: Secondary | ICD-10-CM

## 2018-02-03 NOTE — Patient Instructions (Signed)
Good to see you  Ice is your friend Stay active Injected the butt again  See me again in 6 weeks Happy holidays!

## 2018-02-03 NOTE — Assessment & Plan Note (Signed)
Repeat injection given today.  Discussed differential includes a lumbar radiculopathy.  That makes improvement will need to consider the possibility of PRP.  Discussed which activities to do which wants to avoid.  Discussed the possibility of repeating formal physical therapy.  Discussed icing regimen.  Topical anti-inflammatories given.  Follow-up again in 4 to 8 weeks

## 2018-02-17 ENCOUNTER — Ambulatory Visit: Payer: Managed Care, Other (non HMO) | Admitting: Family Medicine

## 2018-02-17 ENCOUNTER — Ambulatory Visit: Payer: Self-pay | Admitting: Family Medicine

## 2018-02-17 ENCOUNTER — Encounter: Payer: Self-pay | Admitting: Family Medicine

## 2018-02-17 VITALS — BP 144/90 | HR 65 | Temp 98.6°F | Ht 73.0 in | Wt 248.2 lb

## 2018-02-17 DIAGNOSIS — R42 Dizziness and giddiness: Secondary | ICD-10-CM | POA: Diagnosis not present

## 2018-02-17 DIAGNOSIS — I10 Essential (primary) hypertension: Secondary | ICD-10-CM | POA: Diagnosis not present

## 2018-02-17 MED ORDER — MECLIZINE HCL 25 MG PO TABS: 25.0000 mg | ORAL_TABLET | Freq: Three times a day (TID) | ORAL | 0 refills | Status: DC | PRN

## 2018-02-17 MED ORDER — ONDANSETRON 8 MG PO TBDP: 8.0000 mg | ORAL_TABLET | Freq: Three times a day (TID) | ORAL | 0 refills | Status: DC | PRN

## 2018-02-17 NOTE — Assessment & Plan Note (Signed)
No red flags.  No current symptoms.  Advised patient to take meclizine and Zofran and for signs of flare.  He needs to get into see ENT to discuss further.  Will send message to referral coordinator.

## 2018-02-17 NOTE — Progress Notes (Signed)
   Subjective:  Randall Estes is a 63 y.o. male who presents today with a chief complaint of vertigo.   HPI:  Vertigo, chronic problem, worsening Patient last seen for vertigo about 3 months ago.  He was referred to ENT.  He was told that he did not have Mnire's disease, however was started on a diuretic.  He has not yet started this medication.  About a week ago he had sudden worsening of his symptoms and had about 3 hours where he had severe vertigo with nausea and vomiting.  Since then, his symptoms have resolved.  States that he moved his neck in a certain position which precipitated his symptoms.  He had tinnitus in his left ear for several hours after this episode.  Since his flare, he has had not had any recurrent symptoms.  No weakness or numbness.  No headaches.  He did not take Zofran or meclizine.  Symptoms resolved spontaneously.  No other obvious alleviating or aggravating factors.  HTN, chronic problem Currently on lisinopril 5 mg daily.  Blood pressure readings typically in the 120s to 130s at home.  ROS: Per HPI  PMH: He reports that he has quit smoking. He has never used smokeless tobacco. He reports current alcohol use. He reports that he does not use drugs.  Objective:  Physical Exam: BP (!) 144/90 (BP Location: Left Arm, Patient Position: Sitting, Cuff Size: Large)   Pulse 65   Temp 98.6 F (37 C) (Oral)   Ht 6\' 1"  (1.854 m)   Wt 248 lb 3.2 oz (112.6 kg)   SpO2 96%   BMI 32.75 kg/m   Gen: NAD, resting comfortably HEENT: TMs with clear effusion bilaterally. CV: RRR with no murmurs appreciated Pulm: NWOB, CTAB with no crackles, wheezes, or rhonchi Neuro: CN 2-12 intact.  Moves all extremities spontaneously.  Sensation light touch intact grossly.  Assessment/Plan:  Vertigo No red flags.  No current symptoms.  Advised patient to take meclizine and Zofran and for signs of flare.  He needs to get into see ENT to discuss further.  Will send message to referral  coordinator.  Essential hypertension Above goal today.  Typically has been well controlled here and at home.  Continue lisinopril 5 mg daily.  Discussed home blood pressure monitoring with goal 140/90 or lower.   Algis Greenhouse. Jerline Pain, MD 02/17/2018 10:46 AM

## 2018-02-17 NOTE — Patient Instructions (Signed)
It was very nice to see you today!  We will contact will coordinator come back to get you back into see ENT.  Please make sure you take the Zofran and meclizine for the first sign of a vertigo attack.  Please keep an eye on your blood pressure only know if persistently 140/90 or higher at home.  Take care, Dr Jerline Pain

## 2018-02-17 NOTE — Assessment & Plan Note (Signed)
Above goal today.  Typically has been well controlled here and at home.  Continue lisinopril 5 mg daily.  Discussed home blood pressure monitoring with goal 140/90 or lower.

## 2018-02-17 NOTE — Addendum Note (Signed)
Addended by: Vivi Barrack on: 02/17/2018 12:09 PM   Modules accepted: Orders

## 2018-03-17 ENCOUNTER — Ambulatory Visit: Payer: Self-pay | Admitting: Family Medicine

## 2018-06-18 ENCOUNTER — Ambulatory Visit: Payer: Managed Care, Other (non HMO) | Admitting: Family Medicine

## 2018-06-18 ENCOUNTER — Encounter: Payer: Self-pay | Admitting: Family Medicine

## 2018-06-18 ENCOUNTER — Other Ambulatory Visit: Payer: Self-pay

## 2018-06-18 ENCOUNTER — Ambulatory Visit: Payer: Self-pay

## 2018-06-18 VITALS — BP 102/74 | HR 78 | Ht 73.0 in | Wt 242.0 lb

## 2018-06-18 DIAGNOSIS — M79642 Pain in left hand: Principal | ICD-10-CM

## 2018-06-18 DIAGNOSIS — M79641 Pain in right hand: Secondary | ICD-10-CM

## 2018-06-18 DIAGNOSIS — M180 Bilateral primary osteoarthritis of first carpometacarpal joints: Secondary | ICD-10-CM

## 2018-06-18 MED ORDER — MELOXICAM 15 MG PO TABS: 15.0000 mg | ORAL_TABLET | Freq: Every day | ORAL | 0 refills | Status: DC

## 2018-06-18 NOTE — Patient Instructions (Addendum)
Good to see you  meloxicam daily for 3 dayts then as needed never take more then 7 days in a row.  Injected both thumbs careful driving  See me again in 4 weeks if pain in butt still there

## 2018-06-18 NOTE — Assessment & Plan Note (Signed)
Injection given today.  Tolerated procedure well.  Does do a lot of repetitive motions at work.  Discussed icing regimen topical anti-inflammatories.  Meloxicam given for breakthrough pain.  Has had a history of myocardial infarction so would not want him to do it long-term.  Patient is agreement with the plan.  Follow-up with me again in 3 to 4 weeks if having more piriformis syndrome.

## 2018-06-18 NOTE — Progress Notes (Signed)
[facility] [address], [place] [address] Phone: [phone] Subjective:   I Randall Estes am serving as a Education administrator for Dr. Hulan Saas.    CC: thumb pain f/u  [id number]  Randall Estes is a 63 y.o. male coming in with complaint of thumb pain.  Patient has had a history of CMC arthritis.  Patient wants to try conservative therapy and avoiding certain lifting.  Patient was doing topical anti-inflammatories.  Patient has been given an injection one time before.  Patient states bilateral thumb pain. Gluteal pain as well. States his pain isn't too bad but it is "acting up again".  Patient has known arthritic changes of the CMC joints bilaterally.  Mild to moderate nature.  Patient has been doing more repetitive activity at work.  This is could be contributing as well.    Past Medical History:  Diagnosis Date   Coronary artery disease    Hyperlipidemia    Hypertension    Old MI (myocardial infarction) 2009   Past Surgical History:  Procedure Laterality Date   CORONARY ANGIOPLASTY     DENTAL SURGERY     NASAL SINUS SURGERY     VARICOSE VEIN SURGERY     Social History   Socioeconomic History   Marital status: Married    Spouse name: Not on file   Number of children: Not on file   Years of education: Not on file   Highest education level: Not on file  Occupational History   Not on file  Social Needs   Financial resource strain: Not on file   Food insecurity:    Worry: Not on file    Inability: Not on file   Transportation needs:    Medical: Not on file    Non-medical: Not on file  Tobacco Use   Smoking status: Former Smoker   Smokeless tobacco: Never Used   Tobacco comment: 2009  Substance and Sexual Activity   Alcohol use: Yes    Comment: 14   Drug use: Never   Sexual activity: Yes    Partners: Female  Lifestyle   Physical activity:    Days per week: Not on file    Minutes per session: Not on  file   Stress: Not on file  Relationships   Social connections:    Talks on phone: Not on file    Gets together: Not on file    Attends religious service: Not on file    Active member of club or organization: Not on file    Attends meetings of clubs or organizations: Not on file    Relationship status: Not on file  Other Topics Concern   Not on file  Social History Narrative   Not on file   No Known Allergies Family History  Problem Relation Age of Onset   Prostate cancer Father    Cancer Paternal Grandmother    Heart disease Neg Hx      Current Outpatient Medications (Cardiovascular):    atorvastatin (LIPITOR) 40 MG tablet, TAKE 1 TABLET (40 MG TOTAL) BY MOUTH DAILY.   lisinopril (PRINIVIL,ZESTRIL) 10 MG tablet, TAKE 0.5 TABLETS (5 MG TOTAL) BY MOUTH DAILY.   Current Outpatient Medications (Analgesics):    aspirin 81 MG tablet, Take 1 tablet (81 mg total) by mouth daily.   meloxicam (MOBIC) 15 MG tablet, Take 1 tablet (15 mg total) by mouth daily.   Current Outpatient Medications (Other):    Coenzyme Q10 (COQ10) 100 MG  CAPS, Take 200 mg by mouth daily.   meclizine (ANTIVERT) 25 MG tablet, Take 1 tablet (25 mg total) by mouth 3 (three) times daily as needed for dizziness.   minocycline (DYNACIN) 50 MG tablet, Take 50 mg by mouth 2 (two) times daily.   ondansetron (ZOFRAN ODT) 8 MG disintegrating tablet, Take 1 tablet (8 mg total) by mouth every 8 (eight) hours as needed for nausea or vomiting.    Past medical history, social, surgical and family history all reviewed in electronic medical record.  No pertanent information unless stated regarding to the chief complaint.   Review of Systems:  No headache, visual changes, nausea, vomiting, diarrhea, constipation, dizziness, abdominal pain, skin rash, fevers, chills, night sweats, weight loss, swollen lymph nodes, body aches, joint swelling,  chest pain, shortness of breath, mood changes.  Positive muscle  aches  Objective  Blood pressure 102/74, pulse 78, height 6\' 1"  (1.854 m), weight 242 lb (109.8 kg), SpO2 97 %.     gen:: No apparent distress alert and oriented x3 mood and affect normal, dressed appropriately.  HEENT: Pupils equal, extraocular movements intact  Respiratory: Patient's speak in full sentences and does not appear short of breath  Cardiovascular: No lower extremity edema, non tender, no erythema  Skin: Warm dry intact with no signs of infection or rash on extremities or on axial skeleton.  Abdomen: Soft nontender  Neuro: Cranial nerves II through XII are intact, neurovascularly intact in all extremities with 2+ DTRs and 2+ pulses.  Lymph: No lymphadenopathy of posterior or anterior cervical chain or axillae bilaterally.  Gait normal with good balance and coordination.  MSK:  Non tender with full range of motion and good stability and symmetric strength and tone of shoulders, elbows, wrist, hip, knee and ankles bilaterally.  Hand exam bilaterally shows the patient does have a positive grind of the CMC joint.  Patient still has good strength are noted.  Patient is neurovascularly intact.  Deep tendon reflexes proximally are intact.  Procedure: Real-time Ultrasound Guided Injection of right CMC joint Device: GE Logiq Q7 Ultrasound guided injection is preferred based studies that show increased duration, increased effect, greater accuracy, decreased procedural pain, increased response rate, and decreased cost with ultrasound guided versus blind injection.  Verbal informed consent obtained.  Time-out conducted.  Noted no overlying erythema, induration, or other signs of local infection.  Skin prepped in a sterile fashion.  Local anesthesia: Topical Ethyl chloride.  With sterile technique and under real time ultrasound guidance: With a 25-gauge half inch needle injected with 0.5 cc of 0.5% Marcaine and 0.5cc point Kenalog 40 mg/mL Completed without difficulty  Pain immediately  resolved suggesting accurate placement of the medication.  Advised to call if fevers/chills, erythema, induration, drainage, or persistent bleeding.  Images permanently stored and available for review in the ultrasound unit.  Impression: Technically successful ultrasound guided injection.  Procedure: Real-time Ultrasound Guided Injection of left CMC joint Device: GE Logiq Q7 Ultrasound guided injection is preferred based studies that show increased duration, increased effect, greater accuracy, decreased procedural pain, increased response rate, and decreased cost with ultrasound guided versus blind injection.  Verbal informed consent obtained.  Time-out conducted.  Noted no overlying erythema, induration, or other signs of local infection.  Skin prepped in a sterile fashion.  Local anesthesia: Topical Ethyl chloride.  With sterile technique and under real time ultrasound guidance: With a 25-gauge half inch needle injected with 0.5 cc of 0.5% Marcaine and 0.5 cc of Kenalog 40 mg/mL  Completed without difficulty  Pain immediately resolved suggesting accurate placement of the medication.  Advised to call if fevers/chills, erythema, induration, drainage, or persistent bleeding.  Images permanently stored and available for review in the ultrasound unit.  Impression: Technically successful ultrasound guided injection.   Impression and Recommendations:     This case required medical decision making of moderate complexity. The above documentation has been reviewed and is accurate and complete [name], DO       Note: This dictation was prepared with Dragon dictation along with smaller phrase technology. Any transcriptional errors that result from this process are unintentional.

## 2018-07-10 ENCOUNTER — Other Ambulatory Visit: Payer: Self-pay | Admitting: Family Medicine

## 2018-07-15 ENCOUNTER — Other Ambulatory Visit: Payer: Self-pay | Admitting: Cardiovascular Disease

## 2018-07-16 ENCOUNTER — Ambulatory Visit (INDEPENDENT_AMBULATORY_CARE_PROVIDER_SITE_OTHER): Payer: Managed Care, Other (non HMO) | Admitting: Family Medicine

## 2018-07-16 ENCOUNTER — Ambulatory Visit: Payer: Self-pay

## 2018-07-16 ENCOUNTER — Other Ambulatory Visit: Payer: Self-pay

## 2018-07-16 ENCOUNTER — Encounter: Payer: Self-pay | Admitting: Family Medicine

## 2018-07-16 VITALS — BP 122/60 | HR 90 | Ht 73.0 in | Wt 244.0 lb

## 2018-07-16 DIAGNOSIS — M6798 Unspecified disorder of synovium and tendon, other site: Secondary | ICD-10-CM | POA: Diagnosis not present

## 2018-07-16 DIAGNOSIS — M25551 Pain in right hip: Secondary | ICD-10-CM

## 2018-07-16 DIAGNOSIS — M67951 Unspecified disorder of synovium and tendon, right thigh: Secondary | ICD-10-CM

## 2018-07-16 NOTE — Assessment & Plan Note (Signed)
Repeat injection given today.  Tolerated the procedure well.  We discussed with patient about icing regimen, home exercise, which activities of doing which wants to avoid.  Patient will increase activity slowly over the course the next several days.  Follow-up again 6 weeks if worsening pain.

## 2018-07-16 NOTE — Progress Notes (Signed)
[facility] [address], [place] [address] Phone: [phone] Subjective:   [name], am serving as a scribe for Dr. Hulan Saas.   CC: bilateral hand and thumb pain   [id number]   06/18/2018: njection given today.  Tolerated procedure well.  Does do a lot of repetitive motions at work.  Discussed icing regimen topical anti-inflammatories.  Meloxicam given for breakthrough pain.  Has had a history of myocardial infarction so would not want him to do it long-term.  Patient is agreement with the plan.  Follow-up with me again in 3 to 4 weeks if having more piriformis syndrome.  Update 07/16/2018: Randall Estes is a 63 y.o. male coming in with complaint of bilateral thumb pain. Patient states that he has been doing much better since injections. He is having pain in the left thumb IP joint.   States that he also is having right glute pain.  Has responded well to tendinitis injections previously.  Last one was greater than 6 months ago.  Patient states it is starting to get more discomfort again.  Waking him up at night.  Hurts more with certain activities.      Past Medical History:  Diagnosis Date  . Coronary artery disease   . Hyperlipidemia   . Hypertension   . Old MI (myocardial infarction) 2009   Past Surgical History:  Procedure Laterality Date  . CORONARY ANGIOPLASTY    . DENTAL SURGERY    . NASAL SINUS SURGERY    . VARICOSE VEIN SURGERY     Social History   Socioeconomic History  . Marital status: Married    Spouse name: Not on file  . Number of children: Not on file  . Years of education: Not on file  . Highest education level: Not on file  Occupational History  . Not on file  Social Needs  . Financial resource strain: Not on file  . Food insecurity:    Worry: Not on file    Inability: Not on file  . Transportation needs:    Medical: Not on file    Non-medical: Not on file  Tobacco Use  . Smoking status:  Former Research scientist (life sciences)  . Smokeless tobacco: Never Used  . Tobacco comment: 2009  Substance and Sexual Activity  . Alcohol use: Yes    Comment: 14  . Drug use: Never  . Sexual activity: Yes    Partners: Female  Lifestyle  . Physical activity:    Days per week: Not on file    Minutes per session: Not on file  . Stress: Not on file  Relationships  . Social connections:    Talks on phone: Not on file    Gets together: Not on file    Attends religious service: Not on file    Active member of club or organization: Not on file    Attends meetings of clubs or organizations: Not on file    Relationship status: Not on file  Other Topics Concern  . Not on file  Social History Narrative  . Not on file   No Known Allergies Family History  Problem Relation Age of Onset  . Prostate cancer Father   . Cancer Paternal Grandmother   . Heart disease Neg Hx      Current Outpatient Medications (Cardiovascular):  .  atorvastatin (LIPITOR) 40 MG tablet, Take 1 tablet (40 mg total) by mouth daily. TAKE 1 TABLET (40 MG TOTAL) BY MOUTH DAILY. OV  NEEDED .  lisinopril (ZESTRIL) 10 MG tablet, TAKE 0.5 TABLETS (5 MG TOTAL) BY MOUTH DAILY. OV NEEDED   Current Outpatient Medications (Analgesics):  .  aspirin 81 MG tablet, Take 1 tablet (81 mg total) by mouth daily. .  meloxicam (MOBIC) 15 MG tablet, TAKE 1 TABLET BY MOUTH EVERY DAY   Current Outpatient Medications (Other):  Marland Kitchen  Coenzyme Q10 (COQ10) 100 MG CAPS, Take 200 mg by mouth daily. .  meclizine (ANTIVERT) 25 MG tablet, Take 1 tablet (25 mg total) by mouth 3 (three) times daily as needed for dizziness. .  minocycline (DYNACIN) 50 MG tablet, Take 50 mg by mouth 2 (two) times daily. .  ondansetron (ZOFRAN ODT) 8 MG disintegrating tablet, Take 1 tablet (8 mg total) by mouth every 8 (eight) hours as needed for nausea or vomiting.    Past medical history, social, surgical and family history all reviewed in electronic medical record.  No pertanent  information unless stated regarding to the chief complaint.   Review of Systems:  No headache, visual changes, nausea, vomiting, diarrhea, constipation, dizziness, abdominal pain, skin rash, fevers, chills, night sweats, weight loss, swollen lymph nodes, body aches, joint swelling, muscle aches, chest pain, shortness of breath, mood changes.   Objective  There were no vitals taken for this visit. Systems examined below as of    General: No apparent distress alert and oriented x3 mood and affect normal, dressed appropriately.  HEENT: Pupils equal, extraocular movements intact  Respiratory: Patient's speak in full sentences and does not appear short of breath  Cardiovascular: No lower extremity edema, non tender, no erythema  Skin: Warm dry intact with no signs of infection or rash on extremities or on axial skeleton.  Abdomen: Soft nontender  Neuro: Cranial nerves II through XII are intact, neurovascularly intact in all extremities with 2+ DTRs and 2+ pulses.  Lymph: No lymphadenopathy of posterior or anterior cervical chain or axillae bilaterally.  Gait normal with good balance and coordination.  MSK:  Non tender with full range of motion and good stability and symmetric strength and tone of shoulders, elbows, wrist, , knee and ankles bilaterally.  Hand exam shows mild arthritic changes but nothing severe at the moment.  Full range of motion.  Very tender mild tender to palpation over the IP of the left thumb still remain  Right hip exam still shows pain over the glued laterally.  No pain over the piriformis itself specifically.  Negative straight leg test but mild increase in pain with Corky Sox test.  Mild pain over the sacroiliac joint.  Procedure: Real-time Ultrasound Guided Injection of right gluteal tendon sheath Device: GE Logiq Q7 Ultrasound guided injection is preferred based studies that show increased duration, increased effect, greater accuracy, decreased procedural pain, increased  response rate, and decreased cost with ultrasound guided versus blind injection.  Verbal informed consent obtained.  Time-out conducted.  Noted no overlying erythema, induration, or other signs of local infection.  Skin prepped in a sterile fashion.  Local anesthesia: Topical Ethyl chloride.  With sterile technique and under real time ultrasound guidance: With a 21-gauge 2 inch needle patient was injected with a total of 0.5 cc of 0.5% Marcaine and 1 cc of Kenalog 40 mg/mL Completed without difficulty  Pain immediately resolved suggesting accurate placement of the medication.  Advised to call if fevers/chills, erythema, induration, drainage, or persistent bleeding.  Images permanently stored and available for review in the ultrasound unit.  Impression: Technically successful ultrasound guided injection.  Impression and Recommendations:     This case required medical decision making of moderate complexity. The above documentation has been reviewed and is accurate and complete [name], DO       Note: This dictation was prepared with Dragon dictation along with smaller phrase technology. Any transcriptional errors that result from this process are unintentional.

## 2018-07-16 NOTE — Patient Instructions (Signed)
Good to see you.  Ice 20 minutes 2 times daily. Usually after activity and before bed. Exercises 3 times a week.  pennsaid pinkie amount topically 2 times daily as needed.  Gabapentin 200 mg at night  Once weekly vitamin D for 12 weeks.  Turmeric 500mg  daily  Tart cherry extract 1200mg  at night Vitamin D 2000 IU daily  See me again in

## 2018-08-12 ENCOUNTER — Other Ambulatory Visit: Payer: Self-pay | Admitting: Cardiovascular Disease

## 2018-08-19 ENCOUNTER — Other Ambulatory Visit: Payer: Self-pay | Admitting: Cardiovascular Disease

## 2018-09-23 ENCOUNTER — Ambulatory Visit: Payer: Managed Care, Other (non HMO) | Admitting: Cardiovascular Disease

## 2018-10-09 ENCOUNTER — Ambulatory Visit: Payer: Managed Care, Other (non HMO) | Admitting: Family Medicine

## 2018-10-24 ENCOUNTER — Encounter: Payer: Self-pay | Admitting: Family Medicine

## 2018-10-24 ENCOUNTER — Other Ambulatory Visit: Payer: Self-pay

## 2018-10-24 ENCOUNTER — Ambulatory Visit (INDEPENDENT_AMBULATORY_CARE_PROVIDER_SITE_OTHER): Payer: Managed Care, Other (non HMO) | Admitting: Family Medicine

## 2018-10-24 VITALS — BP 159/86 | HR 79 | Temp 98.4°F | Ht 73.0 in | Wt 248.0 lb

## 2018-10-24 DIAGNOSIS — E7800 Pure hypercholesterolemia, unspecified: Secondary | ICD-10-CM | POA: Diagnosis not present

## 2018-10-24 DIAGNOSIS — Z0000 Encounter for general adult medical examination without abnormal findings: Secondary | ICD-10-CM | POA: Diagnosis not present

## 2018-10-24 DIAGNOSIS — Z125 Encounter for screening for malignant neoplasm of prostate: Secondary | ICD-10-CM

## 2018-10-24 DIAGNOSIS — Z23 Encounter for immunization: Secondary | ICD-10-CM

## 2018-10-24 DIAGNOSIS — I25700 Atherosclerosis of coronary artery bypass graft(s), unspecified, with unstable angina pectoris: Secondary | ICD-10-CM | POA: Diagnosis not present

## 2018-10-24 DIAGNOSIS — I10 Essential (primary) hypertension: Secondary | ICD-10-CM

## 2018-10-24 DIAGNOSIS — M19049 Primary osteoarthritis, unspecified hand: Secondary | ICD-10-CM

## 2018-10-24 DIAGNOSIS — Z0001 Encounter for general adult medical examination with abnormal findings: Secondary | ICD-10-CM

## 2018-10-24 LAB — PSA: PSA: 0.35 ng/mL (ref 0.10–4.00)

## 2018-10-24 LAB — COMPREHENSIVE METABOLIC PANEL
ALT: 28 U/L (ref 0–53)
AST: 31 U/L (ref 0–37)
Albumin: 4.3 g/dL (ref 3.5–5.2)
Alkaline Phosphatase: 52 U/L (ref 39–117)
BUN: 20 mg/dL (ref 6–23)
CO2: 28 mEq/L (ref 19–32)
Calcium: 9.5 mg/dL (ref 8.4–10.5)
Chloride: 101 mEq/L (ref 96–112)
Creatinine, Ser: 1.12 mg/dL (ref 0.40–1.50)
GFR: 66.25 mL/min (ref >60.00)
Glucose, Bld: 98 mg/dL (ref 70–99)
Potassium: 4.4 mEq/L (ref 3.5–5.1)
Sodium: 139 mEq/L (ref 135–145)
Total Bilirubin: 0.9 mg/dL (ref 0.2–1.2)
Total Protein: 6.7 g/dL (ref 6.0–8.3)

## 2018-10-24 LAB — LIPID PANEL
Cholesterol: 183 mg/dL (ref 0–200)
HDL: 65.7 mg/dL (ref >39.00)
LDL Cholesterol: 90 mg/dL (ref 0–99)
NonHDL: 117.51
Total CHOL/HDL Ratio: 3
Triglycerides: 138 mg/dL (ref 0.0–149.0)
VLDL: 27.6 mg/dL (ref 0.0–40.0)

## 2018-10-24 LAB — CBC
HCT: 40.6 % (ref 39.0–52.0)
Hemoglobin: 14.1 g/dL (ref 13.0–17.0)
MCHC: 34.7 g/dL (ref 30.0–36.0)
MCV: 95.4 fl (ref 78.0–100.0)
Platelets: 237 10*3/uL (ref 150.0–400.0)
RBC: 4.26 Mil/uL (ref 4.22–5.81)
RDW: 13 % (ref 11.5–15.5)
WBC: 7 10*3/uL (ref 4.0–10.5)

## 2018-10-24 LAB — TSH: TSH: 2 u[IU]/mL (ref 0.35–4.50)

## 2018-10-24 NOTE — Assessment & Plan Note (Signed)
Stable.  Continue management per orthopedics.  Recommend Voltaren as needed.

## 2018-10-24 NOTE — Assessment & Plan Note (Signed)
Above goal though typically in range.  Continue home blood pressure monitoring goal 140/90 or lower.  We will follow-up with cardiology soon.

## 2018-10-24 NOTE — Progress Notes (Signed)
Chief Complaint:  Randall Estes is a 63 y.o. male who presents today for his annual comprehensive physical exam.    Assessment/Plan:  Hand arthritis Stable.  Continue management per orthopedics.  Recommend Voltaren as needed.  CAD (coronary artery disease) of artery bypass graft Stable.  Continue aspirin and Lipitor.  Hyperlipidemia Check lipid panel.  Continue Lipitor 40 mg daily.  Essential hypertension Above goal though typically in range.  Continue home blood pressure monitoring goal 140/90 or lower.  We will follow-up with cardiology soon.   Body mass index is 32.72 kg/m. / Obese BMI Metric Follow Up - 10/24/18 1031      BMI Metric Follow Up-Please document annually   BMI Metric Follow Up  Education provided        Preventative Healthcare: Flu vaccine today.  Check CBC, C met, TSH, lipid panel, and PSA.  He will check with insurance about Shingrix coverage.  Patient Counseling(The following topics were reviewed and/or handout was given):  -Nutrition: Stressed importance of moderation in sodium/caffeine intake, saturated fat and cholesterol, caloric balance, sufficient intake of fresh fruits, vegetables, and fiber.  -Stressed the importance of regular exercise.   -Substance Abuse: Discussed cessation/primary prevention of tobacco, alcohol, or other drug use; driving or other dangerous activities under the influence; availability of treatment for abuse.   -Injury prevention: Discussed safety belts, safety helmets, smoke detector, smoking near bedding or upholstery.   -Sexuality: Discussed sexually transmitted diseases, partner selection, use of condoms, avoidance of unintended pregnancy and contraceptive alternatives.   -Dental health: Discussed importance of regular tooth brushing, flossing, and dental visits.  -Health maintenance and immunizations reviewed. Please refer to Health maintenance section.  Return to care in 1 year for next preventative visit.      Subjective:  HPI:  He has no acute complaints today.   He has recovered from his episode of vertigo.  Is not having recurrent symptoms for several months.  He has been seeing orthopedics for bilateral hand arthritis.  Recently had injections.  Has been doing well with this.  Pain usually worsened with movement and activity.   His stable, chronic medical conditions are outlined below:   # Dyslipidemia / CAD - On Atorvastatin 5m daily and tolerating well - ROS: No reported mylagias  # Hypertension - On lisinopril 551mdaily and tolerating well - ROS: No reported chest pain or shortness of breath  # Vertigo - Referred to ENT  Lifestyle Diet: No specific diets or eating plans.  Exercise: Decreased exercise due to COVID 19 pandemic.   Depression screen PHQ 2/9 10/24/2018  Decreased Interest 0  Down, Depressed, Hopeless 0  PHQ - 2 Score 0    Health Maintenance Due  Topic Date Due  . HIV Screening  12/10/1970  . INFLUENZA VACCINE  09/13/2018     ROS: Per HPI, otherwise a complete review of systems was negative.   PMH:  The following were reviewed and entered/updated in epic: Past Medical History:  Diagnosis Date  . Coronary artery disease   . Hyperlipidemia   . Hypertension   . Old MI (myocardial infarction) 2009   Patient Active Problem List   Diagnosis Date Noted  . 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 Essential hypertension 07/29/2015  . Hyperlipidemia 07/29/2015  . CAD (coronary artery disease) of artery bypass graft 07/29/2015  . MI, old 09/01/2014   Past Surgical History:  Procedure Laterality Date  . CORONARY ANGIOPLASTY    . DENTAL SURGERY    . NASAL SINUS  SURGERY    . VARICOSE VEIN SURGERY      Family History  Problem Relation Age of Onset  . Prostate cancer Father   . Cancer Paternal Grandmother   . Heart disease Neg Hx     Medications- reviewed and updated Current Outpatient Medications  Medication Sig Dispense Refill  . aspirin 81 MG tablet  Take 1 tablet (81 mg total) by mouth daily. 90 tablet 3  . atorvastatin (LIPITOR) 40 MG tablet TAKE 1 TABLET (40 MG TOTAL) BY MOUTH DAILY. TAKE 1 TABLET (40 MG TOTAL) BY MOUTH DAILY. OV NEEDED 30 tablet 11  . Coenzyme Q10 (COQ10) 100 MG CAPS Take 200 mg by mouth daily. 90 each 3  . lisinopril (ZESTRIL) 10 MG tablet TAKE 0.5 TABLETS (5 MG TOTAL) BY MOUTH DAILY. OV NEEDED (Patient taking differently: Take 10 mg by mouth every other day. TAKE 0.5 TABLETS (5 MG TOTAL) BY MOUTH DAILY. OV NEEDED) 45 tablet 1   No current facility-administered medications for this visit.     Allergies-reviewed and updated No Known Allergies  Social History   Socioeconomic History  . Marital status: Married    Spouse name: Not on file  . Number of children: Not on file  . Years of education: Not on file  . Highest education level: Not on file  Occupational History  . Not on file  Social Needs  . Financial resource strain: Not on file  . Food insecurity    Worry: Not on file    Inability: Not on file  . Transportation needs    Medical: Not on file    Non-medical: Not on file  Tobacco Use  . Smoking status: Former Research scientist (life sciences)  . Smokeless tobacco: Never Used  . Tobacco comment: 2009  Substance and Sexual Activity  . Alcohol use: Yes    Comment: 14  . Drug use: Never  . Sexual activity: Yes    Partners: Female  Lifestyle  . Physical activity    Days per week: Not on file    Minutes per session: Not on file  . Stress: Not on file  Relationships  . Social Herbalist on phone: Not on file    Gets together: Not on file    Attends religious service: Not on file    Active member of club or organization: Not on file    Attends meetings of clubs or organizations: Not on file    Relationship status: Not on file  Other Topics Concern  . Not on file  Social History Narrative  . Not on file        Objective:  Physical Exam: BP (!) 159/86   Pulse 79   Temp 98.4 F (36.9 C)   Ht 6' 1"   (1.854 m)   Wt 248 lb (112.5 kg)   SpO2 95%   BMI 32.72 kg/m   Body mass index is 32.72 kg/m. Wt Readings from Last 3 Encounters:  10/24/18 248 lb (112.5 kg)  07/16/18 244 lb (110.7 kg)  06/18/18 242 lb (109.8 kg)   Gen: NAD, resting comfortably HEENT: TMs normal bilaterally. OP clear. No thyromegaly noted.  CV: RRR with no murmurs appreciated Pulm: NWOB, CTAB with no crackles, wheezes, or rhonchi GI: Normal bowel sounds present. Soft, Nontender, Nondistended. MSK: no edema, cyanosis, or clubbing noted Skin: warm, dry Neuro: CN2-12 grossly intact. Strength 5/5 in upper and lower extremities. Reflexes symmetric and intact bilaterally.  Psych: Normal affect and thought content  Algis Greenhouse. Jerline Pain, MD 10/24/2018 10:31 AM

## 2018-10-24 NOTE — Patient Instructions (Signed)
It was very nice to see you today!  You can try using Voltaren gel for your hands.  We will give you a flu vaccine today.  Given on your blood pressure and let me know if persistently 140/90 or higher.  We will check blood work today.  Come back to see me in 1 year for your next physical, or sooner if needed.  Take care, Dr Jerline Pain  Please try these tips to maintain a healthy lifestyle:   Eat at least 3 REAL meals and 1-2 snacks per day.  Aim for no more than 5 hours between eating.  If you eat breakfast, please do so within one hour of getting up.    Obtain twice as many fruits/vegetables as protein or carbohydrate foods for both lunch and dinner. (Half of each meal should be fruits/vegetables, one quarter protein, and one quarter starchy carbs)   Cut down on sweet beverages. This includes juice, soda, and sweet tea.    Exercise at least 150 minutes every week.    Preventive Care 63-4 Years Old, Male Preventive care refers to lifestyle choices and visits with your health care provider that can promote health and wellness. This includes:  A yearly physical exam. This is also called an annual well check.  Regular dental and eye exams.  Immunizations.  Screening for certain conditions.  Healthy lifestyle choices, such as eating a healthy diet, getting regular exercise, not using drugs or products that contain nicotine and tobacco, and limiting alcohol use. What can I expect for my preventive care visit? Physical exam Your health care provider will check:  Height and weight. These may be used to calculate body mass index (BMI), which is a measurement that tells if you are at a healthy weight.  Heart rate and blood pressure.  Your skin for abnormal spots. Counseling Your health care provider may ask you questions about:  Alcohol, tobacco, and drug use.  Emotional well-being.  Home and relationship well-being.  Sexual activity.  Eating habits.  Work and work  Statistician. What immunizations do I need?  Influenza (flu) vaccine  This is recommended every year. Tetanus, diphtheria, and pertussis (Tdap) vaccine  You may need a Td booster every 10 years. Varicella (chickenpox) vaccine  You may need this vaccine if you have not already been vaccinated. Zoster (shingles) vaccine  You may need this after age 63. Measles, mumps, and rubella (MMR) vaccine  You may need at least one dose of MMR if you were born in 1957 or later. You may also need a second dose. Pneumococcal conjugate (PCV13) vaccine  You may need this if you have certain conditions and were not previously vaccinated. Pneumococcal polysaccharide (PPSV23) vaccine  You may need one or two doses if you smoke cigarettes or if you have certain conditions. Meningococcal conjugate (MenACWY) vaccine  You may need this if you have certain conditions. Hepatitis A vaccine  You may need this if you have certain conditions or if you travel or work in places where you may be exposed to hepatitis A. Hepatitis B vaccine  You may need this if you have certain conditions or if you travel or work in places where you may be exposed to hepatitis B. Haemophilus influenzae type b (Hib) vaccine  You may need this if you have certain risk factors. Human papillomavirus (HPV) vaccine  If recommended by your health care provider, you may need three doses over 6 months. You may receive vaccines as individual doses or as more than  one vaccine together in one shot (combination vaccines). Talk with your health care provider about the risks and benefits of combination vaccines. What tests do I need? Blood tests  Lipid and cholesterol levels. These may be checked every 5 years, or more frequently if you are over 56 years old.  Hepatitis C test.  Hepatitis B test. Screening  Lung cancer screening. You may have this screening every year starting at age 57 if you have a 30-pack-year history of smoking  and currently smoke or have quit within the past 15 years.  Prostate cancer screening. Recommendations will vary depending on your family history and other risks.  Colorectal cancer screening. All adults should have this screening starting at age 54 and continuing until age 60. Your health care provider may recommend screening at age 69 if you are at increased risk. You will have tests every 1-10 years, depending on your results and the type of screening test.  Diabetes screening. This is done by checking your blood sugar (glucose) after you have not eaten for a while (fasting). You may have this done every 1-3 years.  Sexually transmitted disease (STD) testing. Follow these instructions at home: Eating and drinking  Eat a diet that includes fresh fruits and vegetables, whole grains, lean protein, and low-fat dairy products.  Take vitamin and mineral supplements as recommended by your health care provider.  Do not drink alcohol if your health care provider tells you not to drink.  If you drink alcohol: ? Limit how much you have to 0-2 drinks a day. ? Be aware of how much alcohol is in your drink. In the [place] one drink equals one 12 oz bottle of beer (355 mL), one 5 oz glass of wine (148 mL), or one 1 oz glass of hard liquor (44 mL). Lifestyle  Take daily care of your teeth and gums.  Stay active. Exercise for at least 30 minutes on 5 or more days each week.  Do not use any products that contain nicotine or tobacco, such as cigarettes, e-cigarettes, and chewing tobacco. If you need help quitting, ask your health care provider.  If you are sexually active, practice safe sex. Use a condom or other form of protection to prevent STIs (sexually transmitted infections).  Talk with your health care provider about taking a low-dose aspirin every day starting at age 35. What's next?  Go to your health care provider once a year for a well check visit.  Ask your health care provider how  often you should have your eyes and teeth checked.  Stay up to date on all vaccines. This information is not intended to replace advice given to you by your health care provider. Make sure you discuss any questions you have with your health care provider. Document Released: 02/25/2015 Document Revised: 01/23/2018 Document Reviewed: 01/23/2018 Elsevier Patient Education  2020 [employer].

## 2018-10-24 NOTE — Assessment & Plan Note (Signed)
Stable.  Continue aspirin and Lipitor.

## 2018-10-24 NOTE — Progress Notes (Signed)
Dr Marigene Ehlers interpretation of your lab work:  Good news! Your blood work is all NORMAL. Keep up the good work and we can recheck in a year.    If you have any additional questions, please give [place] a call or send [place] a message through [place].  Take care, Dr Jerline Pain

## 2018-10-24 NOTE — Assessment & Plan Note (Signed)
Check lipid panel.  Continue Lipitor 40 mg daily. 

## 2018-10-31 ENCOUNTER — Encounter

## 2018-10-31 ENCOUNTER — Encounter: Payer: Self-pay | Admitting: Cardiovascular Disease

## 2018-10-31 ENCOUNTER — Ambulatory Visit: Payer: Managed Care, Other (non HMO) | Admitting: Cardiovascular Disease

## 2018-10-31 ENCOUNTER — Other Ambulatory Visit: Payer: Self-pay

## 2018-10-31 VITALS — BP 143/82 | HR 81 | Ht 73.0 in | Wt 248.2 lb

## 2018-10-31 DIAGNOSIS — I10 Essential (primary) hypertension: Secondary | ICD-10-CM | POA: Diagnosis not present

## 2018-10-31 DIAGNOSIS — E785 Hyperlipidemia, unspecified: Secondary | ICD-10-CM

## 2018-10-31 MED ORDER — COQ10 100 MG PO CAPS: 200.0000 mg | ORAL_CAPSULE | Freq: Every day | ORAL | 3 refills | Status: AC

## 2018-10-31 MED ORDER — ATORVASTATIN CALCIUM 40 MG PO TABS: 40.0000 mg | ORAL_TABLET | Freq: Every day | ORAL | 11 refills | Status: DC

## 2018-10-31 MED ORDER — LISINOPRIL 10 MG PO TABS: 10.0000 mg | ORAL_TABLET | ORAL | 3 refills | Status: DC

## 2018-10-31 NOTE — Assessment & Plan Note (Signed)
History of CAD status post anterior STEMI 08/16/2007 in [place] treated with stenting of his LAD at that time.  He apparently had preserved LV function.his last Myoview stress test performed 04/24/2013 at outside hospital showed a large apical scar with mild peri-infarct part ischemia.  He denies chest pain or shortness of breath.

## 2018-10-31 NOTE — Progress Notes (Signed)
10/31/2018 Randall Estes   07-11-1955  [id number]  Primary Physician Vivi Barrack, MD Primary Cardiologist: Lorretta Harp MD Lupe Carney, [place]  HPI:  Randall Estes is a 63 y.o.  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 He is self-referred to be established in our practice because of known coronary artery disease for ongoing care. I last saw him in the office 07/31/2016. He has a history of 70 pack years of tobacco abuse having quit 08/16/07.He does drink 2-3 drinks a day He has treated hypertension and hyperlipidemia. He had an anterior STEMI 08/16/07 in [place] and had stenting of his LAD at that time. Apparently he had preserved LV function. He works out at [employer] 3 times a week and does cardiovascular workout for 45 minutes without symptoms.  Since I saw him 2 years ago he remained stable.  He does admit to dietary discretion.  He exercise several times a week.  He denies chest pain or shortness of breath.   No outpatient medications have been marked as taking for the 10/31/18 encounter (Office Visit) with Lorretta Harp, MD.     No Known Allergies  Social History   Socioeconomic History  . Marital status: Married    Spouse name: Not on file  . Number of children: Not on file  . Years of education: Not on file  . Highest education level: Not on file  Occupational History  . Not on file  Social Needs  . Financial resource strain: Not on file  . Food insecurity    Worry: Not on file    Inability: Not on file  . Transportation needs    Medical: Not on file    Non-medical: Not on file  Tobacco Use  . Smoking status: Former Research scientist (life sciences)  . Smokeless tobacco: Never Used  . Tobacco comment: 2009  Substance and Sexual Activity  . Alcohol use: Yes    Comment: 14  . Drug use: Never  . Sexual activity: Yes    Partners: Female  Lifestyle  . Physical activity    Days per week: Not on file   Minutes per session: Not on file  . Stress: Not on file  Relationships  . Social Herbalist on phone: Not on file    Gets together: Not on file    Attends religious service: Not on file    Active member of club or organization: Not on file    Attends meetings of clubs or organizations: Not on file    Relationship status: Not on file  . Intimate partner violence    Fear of current or ex partner: Not on file    Emotionally abused: Not on file    Physically abused: Not on file    Forced sexual activity: Not on file  Other Topics Concern  . Not on file  Social History Narrative  . Not on file     Review of Systems: General: negative for chills, fever, night sweats or weight changes.  Cardiovascular: negative for chest pain, dyspnea on exertion, edema, orthopnea, palpitations, paroxysmal nocturnal dyspnea or shortness of breath Dermatological: negative for rash Respiratory: negative for cough or wheezing Urologic: negative for hematuria Abdominal: negative for nausea, vomiting, diarrhea, bright red blood per rectum, melena, or hematemesis Neurologic: negative for visual changes, syncope, or dizziness All other systems reviewed and are otherwise negative except as noted above.    Blood  pressure (!) 143/82, pulse 81, height 6\' 1"  (1.854 m), weight 248 lb 3.2 oz (112.6 kg).  General appearance: alert and no distress Neck: no adenopathy, no carotid bruit, no JVD, supple, symmetrical, trachea midline and thyroid not enlarged, symmetric, no tenderness/mass/nodules Lungs: clear to auscultation bilaterally Heart: regular rate and rhythm, S1, S2 normal, no murmur, click, rub or gallop Extremities: extremities normal, atraumatic, no cyanosis or edema Pulses: 2+ and symmetric Skin: Skin color, texture, turgor normal. No rashes or lesions Neurologic: Alert and oriented X 3, normal strength and tone. Normal symmetric reflexes. Normal coordination and gait  EKG sinus rhythm at 69  with left axis deviation.  I personally reviewed this EKG.  ASSESSMENT AND PLAN:   Essential hypertension History of essential hypertension with blood pressure measured today 143/82.  He is on lisinopril.  Continue current meds at current dosing.  Hyperlipidemia History of hyperlipidemia on atorvastatin 40 mg a day with recent lipid profile performed by his PCP 10/24/2018 revealing total cholesterol 183, LDL of 90 and HDL 65.  He is not at goal but does admit to dietary indiscretion.  He is committed to altering his diet and we will recheck a lipid liver profile in 3 months.  If he still not at goal we will consider up titrating his atorvastatin.  CAD (coronary artery disease) of artery bypass graft History of CAD status post anterior STEMI 08/16/2007 in [place] treated with stenting of his LAD at that time.  He apparently had preserved LV function.his last Myoview stress test performed 04/24/2013 at outside hospital showed a large apical scar with mild peri-infarct part ischemia.  He denies chest pain or shortness of breath.      Lorretta Harp MD FACP,FACC,FAHA, [facility] 10/31/2018 9:28 AM

## 2018-10-31 NOTE — Assessment & Plan Note (Signed)
History of essential hypertension with blood pressure measured today 143/82.  He is on lisinopril.  Continue current meds at current dosing.

## 2018-10-31 NOTE — Patient Instructions (Signed)
Medication Instructions:  Your physician recommends that you continue on your current medications as directed. Please refer to the Current Medication list given to you today. YOUR MEDICATION REFILL PRESCRIPTIONS HAVE BEEN SENT TO YOUR PHARMACY ON FILE. If you need a refill on your cardiac medications before your next appointment, please call your pharmacy.   Lab work: Your physician recommends that you return for lab work in: 3 MONTHS; [place]  If you have labs (blood work) drawn today and your tests are completely normal, you will receive your results only by: Marland Kitchen MyChart Message (if you have MyChart) OR . A paper copy in the mail If you have any lab test that is abnormal or we need to change your treatment, we will call you to review the results.  Testing/Procedures: NONE  Follow-Up: At [facility], you and your health needs are our priority.  As part of our continuing mission to provide you with exceptional heart care, we have created designated Provider Care Teams.  These Care Teams include your primary Cardiologist (physician) and Advanced Practice Providers (APPs -  Physician Assistants and Nurse Practitioners) who all work together to provide you with the care you need, when you need it. You will need a follow up appointment in 12 months with Dr. Quay Burow.  Please call our office 2 months in advance to schedule this/each appointment.

## 2018-10-31 NOTE — Assessment & Plan Note (Signed)
History of hyperlipidemia on atorvastatin 40 mg a day with recent lipid profile performed by his PCP 10/24/2018 revealing total cholesterol 183, LDL of 90 and HDL 65.  He is not at goal but does admit to dietary indiscretion.  He is committed to altering his diet and we will recheck a lipid liver profile in 3 months.  If he still not at goal we will consider up titrating his atorvastatin.

## 2018-11-03 ENCOUNTER — Other Ambulatory Visit: Payer: Self-pay

## 2018-11-03 DIAGNOSIS — Z20822 Contact with and (suspected) exposure to covid-19: Secondary | ICD-10-CM

## 2018-11-05 LAB — NOVEL CORONAVIRUS, NAA: SARS-CoV-2, NAA: NOT DETECTED

## 2018-11-17 ENCOUNTER — Other Ambulatory Visit: Payer: Self-pay

## 2018-11-17 DIAGNOSIS — Z20822 Contact with and (suspected) exposure to covid-19: Secondary | ICD-10-CM

## 2018-11-18 LAB — NOVEL CORONAVIRUS, NAA: SARS-CoV-2, NAA: NOT DETECTED

## 2018-11-21 ENCOUNTER — Ambulatory Visit: Payer: Managed Care, Other (non HMO) | Admitting: Cardiovascular Disease

## 2018-11-24 ENCOUNTER — Other Ambulatory Visit: Payer: Self-pay | Admitting: *Deleted

## 2018-11-24 DIAGNOSIS — Z20822 Contact with and (suspected) exposure to covid-19: Secondary | ICD-10-CM

## 2018-11-25 LAB — NOVEL CORONAVIRUS, NAA: SARS-CoV-2, NAA: NOT DETECTED

## 2018-12-02 ENCOUNTER — Telehealth: Payer: Self-pay | Admitting: General Practice

## 2018-12-02 NOTE — Telephone Encounter (Signed)
Patient informed of negative covid-19 result. Patient verbalized understanding.  °

## 2018-12-22 ENCOUNTER — Other Ambulatory Visit: Payer: Self-pay

## 2018-12-22 ENCOUNTER — Ambulatory Visit (HOSPITAL_COMMUNITY)
Admission: EM | Admit: 2018-12-22 | Discharge: 2018-12-22 | Disposition: A | Discharge status: Home / Self Care | Payer: Managed Care, Other (non HMO) | Attending: Family Medicine | Admitting: Family Medicine

## 2018-12-22 ENCOUNTER — Encounter (HOSPITAL_COMMUNITY): Payer: Self-pay | Admitting: Emergency Medicine

## 2018-12-22 DIAGNOSIS — J029 Acute pharyngitis, unspecified: Secondary | ICD-10-CM

## 2018-12-22 DIAGNOSIS — I10 Essential (primary) hypertension: Secondary | ICD-10-CM

## 2018-12-22 MED ORDER — PREDNISONE 10 MG (21) PO TBPK: ORAL_TABLET | Freq: Every day | ORAL | 0 refills | Status: DC

## 2018-12-22 NOTE — ED Triage Notes (Signed)
Patient reports having a sore throat for 2 weeks.  Initially he thought he had burned his throat, then had an ulcer.  Patient complains of difficultuy swallowing.  Denies trouble breathing

## 2018-12-24 NOTE — ED Provider Notes (Signed)
Randall Estes   [id number] 12/22/18 Arrival Time: [id number]  ASSESSMENT & PLAN:  1. Sore throat   2. Essential hypertension     No signs of peritonsillar abscess. No suspicion for strep throat. Discussed.  Trial of: Meds ordered this encounter  Medications  . predniSONE (STERAPRED UNI-PAK 21 TAB) 10 MG (21) TBPK tablet    Sig: Take by mouth daily. Take as directed.    Dispense:  21 tablet    Refill:  0   Has not taken BP med today. Will watch. Plans f/u with PCP to monitor.  OTC analgesics and throat care as needed  Reviewed expectations re: course of current medical issues. Questions answered. Outlined signs and symptoms indicating need for more acute intervention. Patient verbalized understanding. After Visit Summary given.   SUBJECTIVE:  Randall Estes is a 63 y.o. male who reports a sore throat. Describes as discomfort when swallowing; sharp. Onset gradual beginning 2 weeks ago. Questions some post nasal drainage. Questions if he burned throat with hot food before onset. Symptoms have waxed and waned since beginning; without voice changes. No respiratory symptoms. Normal PO intake but reports discomfort with swallowing. No specific alleviating factors. Fever reported: Fever: absent. No neck pain or swelling. No associated nausea, vomiting, or abdominal pain. No significant fatigue. Known sick contacts: none. Recent travel: none. OTC treatment: Tylenol; some help.  ROS: As per HPI. All other systems negative.    OBJECTIVE:  Vitals:   12/22/18 1345 12/22/18 1348  BP: (!) 182/93 (!) 178/85  Resp: 18   Temp: 98.8 F (37.1 C)   TempSrc: Oral   SpO2: 95%      General appearance: alert; no distress HEENT: throat with mild erythema and cobblestoning; uvula is midline Neck: supple with FROM; no lymphadenopathy CV: RRR Lungs: clear to auscultation bilaterally Abd: soft; non-tender Skin: reveals no rash; warm and dry Neuro: normal gait Psychological: alert  and cooperative; normal mood and affect  No Known Allergies  Past Medical History:  Diagnosis Date  . Coronary artery disease   . Hyperlipidemia   . Hypertension   . Old MI (myocardial infarction) 2009   Social History   Socioeconomic History  . Marital status: Married    Spouse name: Not on file  . Number of children: Not on file  . Years of education: Not on file  . Highest education level: Not on file  Occupational History  . Not on file  Social Needs  . Financial resource strain: Not on file  . Food insecurity    Worry: Not on file    Inability: Not on file  . Transportation needs    Medical: Not on file    Non-medical: Not on file  Tobacco Use  . Smoking status: Former Research scientist (life sciences)  . Smokeless tobacco: Never Used  . Tobacco comment: 2009  Substance and Sexual Activity  . Alcohol use: Yes    Comment: 14  . Drug use: Never  . Sexual activity: Yes    Partners: Female  Lifestyle  . Physical activity    Days per week: Not on file    Minutes per session: Not on file  . Stress: Not on file  Relationships  . Social Herbalist on phone: Not on file    Gets together: Not on file    Attends religious service: Not on file    Active member of club or organization: Not on file    Attends meetings of clubs or organizations: Not  on file    Relationship status: Not on file  . Intimate partner violence    Fear of current or ex partner: Not on file    Emotionally abused: Not on file    Physically abused: Not on file    Forced sexual activity: Not on file  Other Topics Concern  . Not on file  Social History Narrative  . Not on file   Family History  Problem Relation Age of Onset  . Prostate cancer Father   . Cancer Paternal Grandmother   . Heart disease Neg Hx           Vanessa Kick, MD 12/24/18 [phone]

## 2019-01-05 ENCOUNTER — Ambulatory Visit: Payer: Managed Care, Other (non HMO) | Admitting: Family Medicine

## 2019-02-10 DIAGNOSIS — C4491 Basal cell carcinoma of skin, unspecified: Secondary | ICD-10-CM

## 2019-02-10 HISTORY — DX: Basal cell carcinoma of skin, unspecified: C44.91

## 2019-05-12 ENCOUNTER — Ambulatory Visit (INDEPENDENT_AMBULATORY_CARE_PROVIDER_SITE_OTHER): Payer: 59

## 2019-05-12 ENCOUNTER — Other Ambulatory Visit: Payer: Self-pay

## 2019-05-12 ENCOUNTER — Encounter: Payer: Self-pay | Admitting: Family Medicine

## 2019-05-12 ENCOUNTER — Ambulatory Visit: Payer: 59 | Admitting: Family Medicine

## 2019-05-12 VITALS — BP 136/90 | HR 65 | Ht 73.0 in | Wt 256.0 lb

## 2019-05-12 DIAGNOSIS — M25551 Pain in right hip: Secondary | ICD-10-CM

## 2019-05-12 DIAGNOSIS — M7601 Gluteal tendinitis, right hip: Secondary | ICD-10-CM

## 2019-05-12 NOTE — Patient Instructions (Signed)
Good to see you Exercise 3 times a week See me again in 8 weeks

## 2019-05-12 NOTE — Assessment & Plan Note (Signed)
Patient has responded previously.  Chronic problem with exacerbation.  Patient seen in the provider and did have work-up and epidurals with no significant benefit.  Patient did have near complete resolution of pain almost immediately.  Discussed with patient about the meloxicam and chronic medication management.  Discussed the possibility of PRP.  Follow-up again in 4 to 8 weeks.

## 2019-05-12 NOTE — Progress Notes (Signed)
[facility] [address] [place] Phone: [phone] Subjective:   I Randall Estes am serving as a Education administrator for Dr. Hulan Saas.  This visit occurred during the SARS-CoV-2 public health emergency.  Safety protocols were in place, including screening questions prior to the visit, additional usage of staff PPE, and extensive cleaning of exam room while observing appropriate contact time as indicated for disinfecting solutions.   I'm seeing this patient by the request  of:  Vivi Barrack, MD  CC: Right buttocks pain and back pain  [id number]   07/16/2018 Repeat injection given today right glute tendonitis.  Tolerated the procedure well.  We discussed with patient about icing regimen, home exercise, which activities of doing which wants to avoid.  Patient will increase activity slowly over the course the next several days.  Follow-up again 6 weeks if worsening pain.  Update 05/12/2019 Randall Estes is a 64 y.o. male coming in with complaint of right hip pain. Patient states he feels about the same. Still in pain.  Patient was last seen 9 months ago.  Was doing very well for 5 months and pain started coming back.  Was on a provider and had a work-up of his back but no significant improvement with epidurals and nerve root injections.  Patient is back here to see if he could potentially get another injection.     Past Medical History:  Diagnosis Date  . Coronary artery disease   . Hyperlipidemia   . Hypertension   . Old MI (myocardial infarction) 2009   Past Surgical History:  Procedure Laterality Date  . CORONARY ANGIOPLASTY    . DENTAL SURGERY    . NASAL SINUS SURGERY    . VARICOSE VEIN SURGERY     Social History   Socioeconomic History  . Marital status: Married    Spouse name: Not on file  . Number of children: Not on file  . Years of education: Not on file  . Highest education level: Not on file  Occupational History  .  Not on file  Tobacco Use  . Smoking status: Former Research scientist (life sciences)  . Smokeless tobacco: Never Used  . Tobacco comment: 2009  Substance and Sexual Activity  . Alcohol use: Yes    Comment: 14  . Drug use: Never  . Sexual activity: Yes    Partners: Female  Other Topics Concern  . Not on file  Social History Narrative  . Not on file   Social Determinants of Health   Financial Resource Strain:   . Difficulty of Paying Living Expenses:   Food Insecurity:   . Worried About Charity fundraiser in the Last Year:   . Arboriculturist in the Last Year:   Transportation Needs:   . Film/video editor (Medical):   Marland Kitchen Lack of Transportation (Non-Medical):   Physical Activity:   . Days of Exercise per Week:   . Minutes of Exercise per Session:   Stress:   . Feeling of Stress :   Social Connections:   . Frequency of Communication with Friends and Family:   . Frequency of Social Gatherings with Friends and Family:   . Attends Religious Services:   . Active Member of Clubs or Organizations:   . Attends Archivist Meetings:   Marland Kitchen Marital Status:    No Known Allergies Family History  Problem Relation Age of Onset  . Prostate cancer Father   . Cancer Paternal Grandmother   .  Heart disease Neg Hx     Current Outpatient Medications (Endocrine & Metabolic):  .  predniSONE (STERAPRED UNI-PAK 21 TAB) 10 MG (21) TBPK tablet, Take by mouth daily. Take as directed.  Current Outpatient Medications (Cardiovascular):  .  atorvastatin (LIPITOR) 40 MG tablet, Take 1 tablet (40 mg total) by mouth daily. TAKE 1 TABLET (40 MG TOTAL) BY MOUTH DAILY. OV NEEDED .  lisinopril (ZESTRIL) 10 MG tablet, Take 1 tablet (10 mg total) by mouth every other day. TAKE 0.5 TABLETS (5 MG TOTAL) BY MOUTH DAILY.   Current Outpatient Medications (Analgesics):  .  aspirin 81 MG tablet, Take 1 tablet (81 mg total) by mouth daily. .  meloxicam (MOBIC) 7.5 MG tablet, Take 7.5 mg by mouth daily.   Current Outpatient  Medications (Other):  Marland Kitchen  Coenzyme Q10 (COQ10) 100 MG CAPS, Take 200 mg by mouth daily.   Reviewed prior external information including notes and imaging from  primary care provider As well as notes that were available from care everywhere and other healthcare systems.  Past medical history, social, surgical and family history all reviewed in electronic medical record.  No pertanent information unless stated regarding to the chief complaint.   Review of Systems:  No headache, visual changes, nausea, vomiting, diarrhea, constipation, dizziness, abdominal pain, skin rash, fevers, chills, night sweats, weight loss, swollen lymph nodes, body aches, joint swelling, chest pain, shortness of breath, mood changes. POSITIVE muscle aches  Objective  Blood pressure 136/90, pulse 65, height 6\' 1"  (1.854 m), weight 256 lb (116.1 kg), SpO2 98 %.   General: No apparent distress alert and oriented x3 mood and affect normal, dressed appropriately.  Patient has gained some weight since we have last seen him. HEENT: Pupils equal, extraocular movements intact  Respiratory: Patient's speak in full sentences and does not appear short of breath  Cardiovascular: No lower extremity edema, non tender, no erythema  Neuro: Cranial nerves II through XII are intact, neurovascularly intact in all extremities with 2+ DTRs and 2+ pulses.  Gait normal with good balance and coordination.  MSK: Low back exam for shows some loss of lordosis.  Some tenderness to palpation over the right gluteal tendon.  4 out of 5 strength of the right abductors of the hip.  Mild positive Corky Sox noted on the right compared to left.  Does have some mild tenderness noted in the paraspinal musculature lumbar spine.  Procedure: Real-time Ultrasound Guided Injection of right gluteal tendon sheath Device: GE Logiq Q7 Ultrasound guided injection is preferred based studies that show increased duration, increased effect, greater accuracy, decreased  procedural pain, increased response rate, and decreased cost with ultrasound guided versus blind injection.  Verbal informed consent obtained.  Time-out conducted.  Noted no overlying erythema, induration, or other signs of local infection.  Skin prepped in a sterile fashion.  Local anesthesia: Topical Ethyl chloride.  With sterile technique and under real time ultrasound guidance: With a 21-gauge 3 inch needle injected into the right gluteal tendon sheath with a total of 0.5 cc of 0.5% Marcaine and 1 cc of Kenalog 40 mg/mL. Completed without difficulty  Pain immediately resolved suggesting accurate placement of the medication.  Advised to call if fevers/chills, erythema, induration, drainage, or persistent bleeding.  Images permanently stored and available for review in the ultrasound unit.  Impression: Technically successful ultrasound guided injection.    Impression and Recommendations:     This case required medical decision making of moderate complexity. The above documentation has been reviewed  and is accurate and complete [name], DO       Note: This dictation was prepared with Dragon dictation along with smaller phrase technology. Any transcriptional errors that result from this process are unintentional.

## 2019-06-11 ENCOUNTER — Encounter: Payer: Self-pay | Admitting: *Deleted

## 2019-06-15 ENCOUNTER — Other Ambulatory Visit: Payer: Self-pay

## 2019-06-15 ENCOUNTER — Ambulatory Visit: Payer: 59 | Admitting: Dermatology

## 2019-06-15 DIAGNOSIS — Z85828 Personal history of other malignant neoplasm of skin: Secondary | ICD-10-CM | POA: Diagnosis not present

## 2019-06-15 DIAGNOSIS — D229 Melanocytic nevi, unspecified: Secondary | ICD-10-CM

## 2019-06-15 DIAGNOSIS — L821 Other seborrheic keratosis: Secondary | ICD-10-CM | POA: Diagnosis not present

## 2019-06-15 DIAGNOSIS — D225 Melanocytic nevi of trunk: Secondary | ICD-10-CM

## 2019-06-15 NOTE — Progress Notes (Signed)
   Follow-Up Visit   Subjective  Randall Estes is a 64 y.o. male who presents for the following: Follow-up (3 month follow up on left sholder basal cell. No concerns per patient. ).  Growth Location:  Duration: Uncertain Quality: Wife noticed Associated Signs/Symptoms: Modifying Factors:  Severity:  Timing: Context: History of skin cancer  The following portions of the chart were reviewed this encounter and updated as appropriate:     Objective  Well appearing patient in no apparent distress; mood and affect are within normal limits.  All skin waist up examined. Waist up skin examination for Randall Estes.  The site of the basal cell skin cancer is clear with virtually no scar but there is the expected discoloration.  In the middle of his back is a 5 mm textured brown benign keratosis.  This requires no removal nor special watching.  All the moles on his back were checked with dermoscopy and none show atypia.  Routine skin examination in 1 year; his wife will check his back twice annually.  Assessment & Plan  Nevus (2) Left Upper Back; Right Upper Back  Seborrheic keratosis Mid Back  Leave if stable

## 2019-06-19 ENCOUNTER — Encounter: Payer: Self-pay | Admitting: Cardiovascular Disease

## 2019-06-19 ENCOUNTER — Ambulatory Visit: Payer: 59 | Admitting: Cardiovascular Disease

## 2019-06-19 ENCOUNTER — Other Ambulatory Visit: Payer: Self-pay

## 2019-06-19 VITALS — BP 150/94 | HR 67 | Ht 73.0 in | Wt 251.0 lb

## 2019-06-19 DIAGNOSIS — E785 Hyperlipidemia, unspecified: Secondary | ICD-10-CM

## 2019-06-19 DIAGNOSIS — I25810 Atherosclerosis of coronary artery bypass graft(s) without angina pectoris: Secondary | ICD-10-CM | POA: Diagnosis not present

## 2019-06-19 DIAGNOSIS — I10 Essential (primary) hypertension: Secondary | ICD-10-CM | POA: Diagnosis not present

## 2019-06-19 LAB — HEPATIC FUNCTION PANEL
ALT: 23 IU/L (ref 0–44)
AST: 24 IU/L (ref 0–40)
Albumin: 4.5 g/dL (ref 3.8–4.8)
Alkaline Phosphatase: 60 IU/L (ref 39–117)
Bilirubin Total: 0.5 mg/dL (ref 0.0–1.2)
Bilirubin, Direct: 0.16 mg/dL (ref 0.00–0.40)
Total Protein: 7.1 g/dL (ref 6.0–8.5)

## 2019-06-19 LAB — BASIC METABOLIC PANEL
BUN/Creatinine Ratio: 17 (ref 10–24)
BUN: 16 mg/dL (ref 8–27)
CO2: 21 mmol/L (ref 20–29)
Calcium: 9.1 mg/dL (ref 8.6–10.2)
Chloride: 102 mmol/L (ref 96–106)
Creatinine, Ser: 0.96 mg/dL (ref 0.76–1.27)
GFR calc Af Amer: 97 mL/min/{1.73_m2} (ref >59)
GFR calc non Af Amer: 84 mL/min/{1.73_m2} (ref >59)
Glucose: 85 mg/dL (ref 65–99)
Potassium: 4.5 mmol/L (ref 3.5–5.2)
Sodium: 137 mmol/L (ref 134–144)

## 2019-06-19 LAB — LIPID PANEL
Chol/HDL Ratio: 2.8 ratio (ref 0.0–5.0)
Cholesterol, Total: 168 mg/dL (ref 100–199)
HDL: 59 mg/dL (ref >39)
LDL Chol Calc (NIH): 86 mg/dL (ref 0–99)
Triglycerides: 132 mg/dL (ref 0–149)
VLDL Cholesterol Cal: 23 mg/dL (ref 5–40)

## 2019-06-19 MED ORDER — CARVEDILOL 6.25 MG PO TABS: 6.2500 mg | ORAL_TABLET | Freq: Two times a day (BID) | ORAL | 3 refills | Status: DC

## 2019-06-19 NOTE — Progress Notes (Signed)
06/19/2019 [name]   02-Sep-1955  [id number]  Primary Physician Vivi Barrack, MD Primary Cardiologist: Lorretta Harp MD Lupe Carney, [place]  HPI:  Randall Estes is a 64 y.o.    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 He is self-referred to be established in our practice because of known coronary artery disease for ongoing care.I last saw him in the office  10/31/2018.He has a history of 70 pack years of tobacco abuse having quit 08/16/07.He does drink 2-3 drinks a day He has treated hypertension and hyperlipidemia. He had an anterior STEMI 08/16/07 in [place] and had stenting of his LAD at that time. Apparently hehadpreserved LV function. He works out at [employer] 3 times a week and does cardiovascular workout for 45 minutes without symptoms.  Since I saw him in the office 6 months ago he continues to do well.  He was started on losartan by his PCP because of elevated hypertension.  Estimated dietary indiscretion.  He works out several days a week in the gym without symptoms.    Current Meds  Medication Sig  . aspirin 81 MG tablet Take 1 tablet (81 mg total) by mouth daily.  Marland Kitchen atorvastatin (LIPITOR) 40 MG tablet Take 1 tablet (40 mg total) by mouth daily. TAKE 1 TABLET (40 MG TOTAL) BY MOUTH DAILY. OV NEEDED  . carvedilol (COREG) 6.25 MG tablet Take 1 tablet (6.25 mg total) by mouth 2 (two) times daily.  . Coenzyme Q10 (COQ10) 100 MG CAPS Take 200 mg by mouth daily.  Stasia Cavalier (EUCRISA) 2 % OINT Eucrisa 2 % topical ointment  APPLY TO AFFECTED AREA EVERY DAY  . losartan (COZAAR) 100 MG tablet Take 100 mg by mouth daily.  . Multiple Vitamin (ONE DAILY) tablet Take by mouth.  . zinc gluconate 50 MG tablet Take by mouth.  . [DISCONTINUED] carvedilol (COREG) 3.125 MG tablet Take 3.125 mg by mouth 2 (two) times daily.  . [DISCONTINUED] lisinopril (ZESTRIL) 10 MG tablet Take 1 tablet (10 mg total)  by mouth every other day. TAKE 0.5 TABLETS (5 MG TOTAL) BY MOUTH DAILY.  . [DISCONTINUED] meloxicam (MOBIC) 7.5 MG tablet Take 7.5 mg by mouth daily.  . [DISCONTINUED] predniSONE (STERAPRED UNI-PAK 21 TAB) 10 MG (21) TBPK tablet Take by mouth daily. Take as directed.  . [DISCONTINUED] valACYclovir (VALTREX) 1000 MG tablet Take by mouth.     No Known Allergies  Social History   Socioeconomic History  . Marital status: Married    Spouse name: Not on file  . Number of children: Not on file  . Years of education: Not on file  . Highest education level: Not on file  Occupational History  . Not on file  Tobacco Use  . Smoking status: Former Research scientist (life sciences)  . Smokeless tobacco: Never Used  . Tobacco comment: 2009  Substance and Sexual Activity  . Alcohol use: Yes    Comment: 14  . Drug use: Never  . Sexual activity: Yes    Partners: Female  Other Topics Concern  . Not on file  Social History Narrative  . Not on file   Social Determinants of Health   Financial Resource Strain:   . Difficulty of Paying Living Expenses:   Food Insecurity:   . Worried About Charity fundraiser in the Last Year:   . Arboriculturist in the Last Year:   Transportation Needs:   .  Lack of Transportation (Medical):   Marland Kitchen Lack of Transportation (Non-Medical):   Physical Activity:   . Days of Exercise per Week:   . Minutes of Exercise per Session:   Stress:   . Feeling of Stress :   Social Connections:   . Frequency of Communication with Friends and Family:   . Frequency of Social Gatherings with Friends and Family:   . Attends Religious Services:   . Active Member of Clubs or Organizations:   . Attends Archivist Meetings:   Marland Kitchen Marital Status:   Intimate Partner Violence:   . Fear of Current or Ex-Partner:   . Emotionally Abused:   Marland Kitchen Physically Abused:   . Sexually Abused:      Review of Systems: General: negative for chills, fever, night sweats or weight changes.  Cardiovascular:  negative for chest pain, dyspnea on exertion, edema, orthopnea, palpitations, paroxysmal nocturnal dyspnea or shortness of breath Dermatological: negative for rash Respiratory: negative for cough or wheezing Urologic: negative for hematuria Abdominal: negative for nausea, vomiting, diarrhea, bright red blood per rectum, melena, or hematemesis Neurologic: negative for visual changes, syncope, or dizziness All other systems reviewed and are otherwise negative except as noted above.    Blood pressure (!) 150/94, pulse 67, height 6\' 1"  (1.854 m), weight 251 lb (113.9 kg), SpO2 96 %.  General appearance: alert and no distress Neck: no adenopathy, no carotid bruit, no JVD, supple, symmetrical, trachea midline and thyroid not enlarged, symmetric, no tenderness/mass/nodules Lungs: clear to auscultation bilaterally Heart: regular rate and rhythm, S1, S2 normal, no murmur, click, rub or gallop Extremities: extremities normal, atraumatic, no cyanosis or edema Pulses: 2+ and symmetric Skin: Skin color, texture, turgor normal. No rashes or lesions Neurologic: Alert and oriented X 3, normal strength and tone. Normal symmetric reflexes. Normal coordination and gait  EKG sinus rhythm at 67 with septal Q waves and nonspecific ST and T wave changes.  I personally reviewed this EKG.  ASSESSMENT AND PLAN:   Essential hypertension History of essential hypertension with blood pressure measured today 150/94.  He is on low-dose carvedilol and when she started on losartan.  We will increase his carvedilol to 6.25 mg p.o. twice daily and check a basic metabolic panel.  He does check his blood pressure at home it usually is in the 130 range.  Hyperlipidemia History of hyperlipidemia on statin therapy.  We will recheck a lipid liver profile today.  CAD (coronary artery disease) of artery bypass graft History of CAD status post anterior STEMI 08/16/2007 in [place] with stenting of his LAD at that time.  He  works out several days a week without limitation.  He denies chest pain or shortness of breath.      Lorretta Harp MD FACP,FACC,FAHA, [facility] 06/19/2019 9:28 AM

## 2019-06-19 NOTE — Assessment & Plan Note (Signed)
History of essential hypertension with blood pressure measured today 150/94.  He is on low-dose carvedilol and when she started on losartan.  We will increase his carvedilol to 6.25 mg p.o. twice daily and check a basic metabolic panel.  He does check his blood pressure at home it usually is in the 130 range.

## 2019-06-19 NOTE — Assessment & Plan Note (Signed)
History of hyperlipidemia on statin therapy.  We will recheck a lipid liver profile today. 

## 2019-06-19 NOTE — Patient Instructions (Signed)
Medication Instructions:  INCREASE CARVEDILOL TO 6.25 MG TWICE DAILY= 2 OF THE 3.125 MG TABLETS TWICE DAILY  *If you need a refill on your cardiac medications before your next appointment, please call your pharmacy*   Lab Work: Your physician recommends that you HAVE LAB WORK TODAY  If you have labs (blood work) drawn today and your tests are completely normal, you will receive your results only by: Marland Kitchen MyChart Message (if you have MyChart) OR . A paper copy in the mail If you have any lab test that is abnormal or we need to change your treatment, we will call you to review the results.   Follow-Up: At [facility], you and your health needs are our priority.  As part of our continuing mission to provide you with exceptional heart care, we have created designated Provider Care Teams.  These Care Teams include your primary Cardiologist (physician) and Advanced Practice Providers (APPs -  Physician Assistants and Nurse Practitioners) who all work together to provide you with the care you need, when you need it.  We recommend signing up for the patient portal called "MyChart".  Sign up information is provided on this After Visit Summary.  MyChart is used to connect with patients for Virtual Visits (Telemedicine).  Patients are able to view lab/test results, encounter notes, upcoming appointments, etc.  Non-urgent messages can be sent to your provider as well.   To learn more about what you can do with MyChart, go to [email or web address].    Your next appointment:    Your physician wants you to follow-up in: [place] will receive a reminder letter in the mail two months in advance. If you don't receive a letter, please call our office to schedule the follow-up appointment.   Your physician wants you to follow-up in: [place] will receive a reminder letter in the mail two months in advance. If you don't receive a letter, please call our office to schedule the  follow-up appointment.

## 2019-06-19 NOTE — Assessment & Plan Note (Signed)
History of CAD status post anterior STEMI 08/16/2007 in [place] with stenting of his LAD at that time.  He works out several days a week without limitation.  He denies chest pain or shortness of breath.

## 2019-06-22 ENCOUNTER — Ambulatory Visit: Payer: 59 | Admitting: Family Medicine

## 2019-06-22 ENCOUNTER — Encounter: Payer: Self-pay | Admitting: Dermatology

## 2019-06-26 ENCOUNTER — Other Ambulatory Visit: Payer: Self-pay

## 2019-06-26 ENCOUNTER — Telehealth: Payer: Self-pay

## 2019-06-26 DIAGNOSIS — I25810 Atherosclerosis of coronary artery bypass graft(s) without angina pectoris: Secondary | ICD-10-CM

## 2019-06-26 DIAGNOSIS — E7800 Pure hypercholesterolemia, unspecified: Secondary | ICD-10-CM

## 2019-06-26 DIAGNOSIS — I10 Essential (primary) hypertension: Secondary | ICD-10-CM

## 2019-06-26 MED ORDER — ATORVASTATIN CALCIUM 80 MG PO TABS
80.0000 mg | ORAL_TABLET | Freq: Every day | ORAL | 3 refills | Status: DC
Start: 2019-06-26 — End: 2020-02-26

## 2019-06-26 NOTE — Telephone Encounter (Signed)
      I went in pt's chart to see who called him. 

## 2019-06-26 NOTE — Telephone Encounter (Signed)
Already spoke to patient.Lab results given.

## 2019-07-08 ENCOUNTER — Encounter: Payer: Self-pay | Admitting: Family Medicine

## 2019-07-08 ENCOUNTER — Other Ambulatory Visit: Payer: Self-pay

## 2019-07-08 ENCOUNTER — Ambulatory Visit: Payer: 59 | Admitting: Family Medicine

## 2019-07-08 DIAGNOSIS — M7601 Gluteal tendinitis, right hip: Secondary | ICD-10-CM

## 2019-07-08 NOTE — Patient Instructions (Signed)
Good to see you Keep up with everything See me in 3 months or if doing well see me when you need me

## 2019-07-08 NOTE — Progress Notes (Signed)
[facility] [address] [place] Phone: [phone] Subjective:   .scribe This visit occurred during the SARS-CoV-2 public health emergency.  Safety protocols were in place, including screening questions prior to the visit, additional usage of staff PPE, and extensive cleaning of exam room while observing appropriate contact time as indicated for disinfecting solutions.    I'm seeing this patient by the request  of:  Vivi Barrack, MD  CC: Right gluteal tenderness follow-up  [id number]   3/30/20201 Patient has responded previously.  Chronic problem with exacerbation.  Patient seen in the provider and did have work-up and epidurals with no significant benefit.  Patient did have near complete resolution of pain almost immediately.  Discussed with patient about the meloxicam and chronic medication management.  Discussed the possibility of PRP.  Follow-up again in 4 to 8 weeks.  Update 07/08/2019 Randall Estes is a 64 y.o. male coming in with complaint of right glute pain. Patient states that the injection did help to alleviate his pain.  Patient states 95-100% better at the moment.  Very minimal discomfort.  Some mild tightness but nothing severe.    Injection given May 02, 2019  Past Medical History:  Diagnosis Date  . Basal cell carcinoma 02/10/2019   sup-left post shoulder ([id number])  . Coronary artery disease   . Hyperlipidemia   . Hypertension   . Old MI (myocardial infarction) 2009   Past Surgical History:  Procedure Laterality Date  . CORONARY ANGIOPLASTY    . DENTAL SURGERY    . NASAL SINUS SURGERY    . VARICOSE VEIN SURGERY     Social History   Socioeconomic History  . Marital status: Married    Spouse name: Not on file  . Number of children: Not on file  . Years of education: Not on file  . Highest education level: Not on file  Occupational History  . Not on file  Tobacco Use  . Smoking status: Former  Research scientist (life sciences)  . Smokeless tobacco: Never Used  . Tobacco comment: 2009  Substance and Sexual Activity  . Alcohol use: Yes    Comment: 14  . Drug use: Never  . Sexual activity: Yes    Partners: Female  Other Topics Concern  . Not on file  Social History Narrative  . Not on file   Social Determinants of Health   Financial Resource Strain:   . Difficulty of Paying Living Expenses:   Food Insecurity:   . Worried About Charity fundraiser in the Last Year:   . Arboriculturist in the Last Year:   Transportation Needs:   . Film/video editor (Medical):   Marland Kitchen Lack of Transportation (Non-Medical):   Physical Activity:   . Days of Exercise per Week:   . Minutes of Exercise per Session:   Stress:   . Feeling of Stress :   Social Connections:   . Frequency of Communication with Friends and Family:   . Frequency of Social Gatherings with Friends and Family:   . Attends Religious Services:   . Active Member of Clubs or Organizations:   . Attends Archivist Meetings:   Marland Kitchen Marital Status:    No Known Allergies Family History  Problem Relation Age of Onset  . Prostate cancer Father   . Cancer Paternal Grandmother   . Heart disease Neg Hx      Current Outpatient Medications (Cardiovascular):  .  atorvastatin (LIPITOR) 80 MG  tablet, Take 1 tablet (80 mg total) by mouth daily. .  carvedilol (COREG) 6.25 MG tablet, Take 1 tablet (6.25 mg total) by mouth 2 (two) times daily. Marland Kitchen  losartan (COZAAR) 100 MG tablet, Take 100 mg by mouth daily.   Current Outpatient Medications (Analgesics):  .  aspirin 81 MG tablet, Take 1 tablet (81 mg total) by mouth daily.   Current Outpatient Medications (Other):  Marland Kitchen  Coenzyme Q10 (COQ10) 100 MG CAPS, Take 200 mg by mouth daily. Stasia Cavalier (EUCRISA) 2 % OINT, Eucrisa 2 % topical ointment  APPLY TO AFFECTED AREA EVERY DAY .  Multiple Vitamin (ONE DAILY) tablet, Take by mouth. .  zinc gluconate 50 MG tablet, Take by mouth.   Reviewed prior  external information including notes and imaging from  primary care provider As well as notes that were available from care everywhere and other healthcare systems.  Past medical history, social, surgical and family history all reviewed in electronic medical record.  No pertanent information unless stated regarding to the chief complaint.   Review of Systems:  No headache, visual changes, nausea, vomiting, diarrhea, constipation, dizziness, abdominal pain, skin rash, fevers, chills, night sweats, weight loss, swollen lymph nodes, body aches, joint swelling, chest pain, shortness of breath, mood changes. POSITIVE muscle aches  Objective  Blood pressure 138/72, pulse 75, height 6\' 1"  (1.854 m), weight 253 lb (114.8 kg), SpO2 98 %.   General: No apparent distress alert and oriented x3 mood and affect normal, dressed appropriately.  HEENT: Pupils equal, extraocular movements intact  Respiratory: Patient's speak in full sentences and does not appear short of breath  Cardiovascular: No lower extremity edema, non tender, no erythema  Neuro: Cranial nerves II through XII are intact, neurovascularly intact in all extremities with 2+ DTRs and 2+ pulses.  Gait normal with good balance and coordination.  MSK:  Non tender with full range of motion and good stability and symmetric strength and tone of shoulders, elbows, wrist,  knee and ankles bilaterally.  Right hip exam shows the patient still has some very mild tightness with FABER test.  Some very minimal discomfort noted in the paraspinal musculature of the lumbar spine.  Neurovascularly intact distally.   Impression and Recommendations:     The above documentation has been reviewed and is accurate and complete [name], DO       Note: This dictation was prepared with Dragon dictation along with smaller phrase technology. Any transcriptional errors that result from this process are unintentional.

## 2019-07-08 NOTE — Assessment & Plan Note (Signed)
Significant improvement from previous injection.  At this point I think patient is doing well and can follow-up with me in 3 month intervals but I believe patient will do well with conservative therapy.

## 2019-07-14 ENCOUNTER — Ambulatory Visit: Payer: 59 | Admitting: Cardiovascular Disease

## 2019-07-14 ENCOUNTER — Other Ambulatory Visit: Payer: Self-pay

## 2019-07-14 ENCOUNTER — Telehealth: Payer: Self-pay

## 2019-07-14 ENCOUNTER — Encounter: Payer: Self-pay | Admitting: Cardiovascular Disease

## 2019-07-14 VITALS — BP 144/86 | HR 68 | Ht 73.0 in | Wt 255.0 lb

## 2019-07-14 DIAGNOSIS — R002 Palpitations: Secondary | ICD-10-CM | POA: Diagnosis not present

## 2019-07-14 DIAGNOSIS — G4733 Obstructive sleep apnea (adult) (pediatric): Secondary | ICD-10-CM | POA: Diagnosis not present

## 2019-07-14 DIAGNOSIS — E782 Mixed hyperlipidemia: Secondary | ICD-10-CM | POA: Diagnosis not present

## 2019-07-14 NOTE — Telephone Encounter (Signed)
Called pt and went over monitor instructions, verified address. 2 week ZIO was ordered to be mailed to pt.

## 2019-07-14 NOTE — Patient Instructions (Signed)
Medication Instructions:  Your physician recommends that you continue on your current medications as directed. Please refer to the Current Medication list given to you today.  *If you need a refill on your cardiac medications before your next appointment, please call your pharmacy*  Testing/Procedures: Dr. Gwenlyn Found has ordered a 2 week ZIO monitor   Follow-Up: At [facility], you and your health needs are our priority.  As part of our continuing mission to provide you with exceptional heart care, we have created designated Provider Care Teams.  These Care Teams include your primary Cardiologist (physician) and Advanced Practice Providers (APPs -  Physician Assistants and Nurse Practitioners) who all work together to provide you with the care you need, when you need it.  We recommend signing up for the patient portal called "MyChart".  Sign up information is provided on this After Visit Summary.  MyChart is used to connect with patients for Virtual Visits (Telemedicine).  Patients are able to view lab/test results, encounter notes, upcoming appointments, etc.  Non-urgent messages can be sent to your provider as well.   To learn more about what you can do with MyChart, go to [email or web address].    Your next appointment:   6 month(s)  The format for your next appointment:   In Person  Provider:   You may see Dr. Gwenlyn Found or one of the following Advanced Practice Providers on your designated Care Team:    Kerin Ransom, PA-C  [place], [place]  Coletta Memos, [place]    Other Instructions  [place] Monitor Instructions   Your physician has requested you wear your ZIO patch monitor 14 days.   This is a single patch monitor.  [employer] supplies one patch monitor per enrollment.  Additional stickers are not available.   Please do not apply patch if you will be having a Nuclear Stress Test, Echocardiogram, Cardiac CT, MRI, or Chest Xray during the time frame you would be  wearing the monitor. The patch cannot be worn during these tests.  You cannot remove and re-apply the ZIO XT patch monitor.   Your ZIO patch monitor will be sent USPS Priority mail from [facility] directly to your home address. The monitor may also be mailed to a PO BOX if home delivery is not available.   It may take 3-5 days to receive your monitor after you have been enrolled.   Once you have received you monitor, please review enclosed instructions.  Your monitor has already been registered assigning a specific monitor serial # to you.   Applying the monitor   Shave hair from upper left chest.   Hold abrader disc by orange tab.  Rub abrader in 40 strokes over left upper chest as indicated in your monitor instructions.   Clean area with 4 enclosed alcohol pads .  Use all pads to assure are is cleaned thoroughly.  Let dry.   Apply patch as indicated in monitor instructions.  Patch will be place under collarbone on left side of chest with arrow pointing upward.   Rub patch adhesive wings for 2 minutes.Remove white label marked "1".  Remove white label marked "2".  Rub patch adhesive wings for 2 additional minutes.   While looking in a mirror, press and release button in center of patch.  A small green light will flash 3-4 times .  This will be your only indicator the monitor has been turned on.     Do not shower for the first 24 hours.  You may shower after the first 24 hours.   Press button if you feel a symptom. You will hear a small click.  Record Date, Time and Symptom in the Patient Log Book.   When you are ready to remove patch, follow instructions on last 2 pages of Patient Log Book.  Stick patch monitor onto last page of Patient Log Book.   Place Patient Log Book in [place] box.  Use locking tab on box and tape box closed securely.  The [employer] has [employer] on it.  Please place in mailbox as soon as possible.  Your physician should have your test results  approximately 7 days after the monitor has been mailed back to [facility].   Call [place] at [phone] if you have questions regarding your ZIO XT patch monitor.  Call them immediately if you see an orange light blinking on your monitor.   If your monitor falls off in less than 4 days contact our Monitor department at [phone].  If your monitor becomes loose or falls off after 4 days call [employer] at [phone] for suggestions on securing your monitor.

## 2019-07-14 NOTE — Assessment & Plan Note (Signed)
Remote history of uvulopalatopharyngoplasty over 20 years ago.

## 2019-07-14 NOTE — Assessment & Plan Note (Signed)
History of hyperlipidemia on high-dose atorvastatin (80 mg, up from 40 mg).  He is scheduled to have a repeat lipid liver profile next month.

## 2019-07-14 NOTE — Assessment & Plan Note (Signed)
Randall Estes  returns a for follow-up of palpitations.  He says that he had palpitations since beginning losartan.  They were bad this past weekend.  He cut his antihypertensive medications in half and his palpitations improved.  He does drink 3 to 4 cups of coffee in the morning.  I am going to check a 2-week Zio patch to further evaluate.

## 2019-07-14 NOTE — Progress Notes (Signed)
07/14/2019 Randall Estes   [birth year]  [id number]  Primary Physician Randall Barrack, MD Primary Cardiologist: Randall Harp MD Randall Estes  HPI:  Randall Estes is a 64 y.o.  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 He is self-referred to be established in our practice because of known coronary artery disease for ongoing care.I last saw him in the office  06/19/2019.He has a history of 70 pack years of tobacco abuse having quit 08/16/07.He does drink 2-3 drinks a day He has treated hypertension and hyperlipidemia. He had an anterior STEMI 08/16/07 in [place] and had stenting of his LAD at that time. Apparently hehadpreserved LV function. He works out at [employer] 3 times a week and does cardiovascular workout for 45 minutes without symptoms.  He was started on losartan by his PCP because of elevated hypertension.    He did admit to dietary indiscretion.  He works out several days a week in the gym without symptoms.  Since I saw him 3 weeks ago he is had increased frequency of noticeable palpitations which he attributes to his losartan.  He does drink 3 to 4 cups of coffee in the morning.  He cut his losartan in half and palpitations improved.    Current Meds  Medication Sig  . aspirin 81 MG tablet Take 1 tablet (81 mg total) by mouth daily.  Marland Kitchen atorvastatin (LIPITOR) 80 MG tablet Take 1 tablet (80 mg total) by mouth daily.  . carvedilol (COREG) 6.25 MG tablet Take 1 tablet (6.25 mg total) by mouth 2 (two) times daily.  . Coenzyme Q10 (COQ10) 100 MG CAPS Take 200 mg by mouth daily.  Randall Estes (EUCRISA) 2 % OINT Eucrisa 2 % topical ointment  APPLY TO AFFECTED AREA EVERY DAY  . losartan (COZAAR) 100 MG tablet Take 100 mg by mouth daily.  . Multiple Vitamin (ONE DAILY) tablet Take by mouth.  . zinc gluconate 50 MG tablet Take by mouth.     No Known Allergies  Social History   Socioeconomic  History  . Marital status: Married    Spouse name: Not on file  . Number of children: Not on file  . Years of education: Not on file  . Highest education level: Not on file  Occupational History  . Not on file  Tobacco Use  . Smoking status: Former Research scientist (life sciences)  . Smokeless tobacco: Never Used  . Tobacco comment: 2009  Substance and Sexual Activity  . Alcohol use: Yes    Comment: 14  . Drug use: Never  . Sexual activity: Yes    Partners: Female  Other Topics Concern  . Not on file  Social History Narrative  . Not on file   Social Determinants of Health   Financial Resource Strain:   . Difficulty of Paying Living Expenses:   Food Insecurity:   . Worried About Charity fundraiser in the Last Year:   . Arboriculturist in the Last Year:   Transportation Needs:   . Film/video editor (Medical):   Marland Kitchen Lack of Transportation (Non-Medical):   Physical Activity:   . Days of Exercise per Week:   . Minutes of Exercise per Session:   Stress:   . Feeling of Stress :   Social Connections:   . Frequency of Communication with Friends and Family:   . Frequency of Social Gatherings with Friends and Family:   .  Attends Religious Services:   . Active Member of Clubs or Organizations:   . Attends Archivist Meetings:   Marland Kitchen Marital Status:   Intimate Partner Violence:   . Fear of Current or Ex-Partner:   . Emotionally Abused:   Marland Kitchen Physically Abused:   . Sexually Abused:      Review of Systems: General: negative for chills, fever, night sweats or weight changes.  Cardiovascular: negative for chest pain, dyspnea on exertion, edema, orthopnea, palpitations, paroxysmal nocturnal dyspnea or shortness of breath Dermatological: negative for rash Respiratory: negative for cough or wheezing Urologic: negative for hematuria Abdominal: negative for nausea, vomiting, diarrhea, bright red blood per rectum, melena, or hematemesis Neurologic: negative for visual changes, syncope, or  dizziness All other systems reviewed and are otherwise negative except as noted above.    Blood pressure (!) 144/86, pulse 68, height 6\' 1"  (1.854 m), weight 255 lb (115.7 kg), SpO2 97 %.  General appearance: alert and no distress Neck: no adenopathy, no carotid bruit, no JVD, supple, symmetrical, trachea midline and thyroid not enlarged, symmetric, no tenderness/mass/nodules Lungs: clear to auscultation bilaterally Heart: regular rate and rhythm, S1, S2 normal, no murmur, click, rub or gallop Extremities: extremities normal, atraumatic, no cyanosis or edema Pulses: 2+ and symmetric Skin: Skin color, texture, turgor normal. No rashes or lesions Neurologic: Alert and oriented X 3, normal strength and tone. Normal symmetric reflexes. Normal coordination and gait  EKG not performed today  ASSESSMENT AND PLAN:   Palpitations [name]  returns a for follow-up of palpitations.  He says that he had palpitations since beginning losartan.  They were bad this past weekend.  He cut his antihypertensive medications in half and his palpitations improved.  He does drink 3 to 4 cups of coffee in the morning.  I am going to check a 2-week Zio patch to further evaluate.  Hyperlipidemia History of hyperlipidemia on high-dose atorvastatin (80 mg, up from 40 mg).  He is scheduled to have a repeat lipid liver profile next month.  Obstructive sleep apnea Remote history of uvulopalatopharyngoplasty over 20 years ago.      Randall Harp MD FACP,FACC,FAHA, [facility] 07/14/2019 9:37 AM

## 2019-07-19 ENCOUNTER — Ambulatory Visit (INDEPENDENT_AMBULATORY_CARE_PROVIDER_SITE_OTHER): Payer: 59

## 2019-07-19 DIAGNOSIS — R002 Palpitations: Secondary | ICD-10-CM | POA: Diagnosis not present

## 2019-08-19 ENCOUNTER — Telehealth: Payer: Self-pay

## 2019-08-19 NOTE — Telephone Encounter (Signed)
Spoke to patient monitor results given.Stated he decreased Losartan to 50 mg daily and feels a lot better.Stated B/P has been normal.He wanted to make sure Dr.Berry is ok with him taking 50 mg instead of 100 mg.Advised I will send message to Dr.Berry.

## 2019-08-20 NOTE — Telephone Encounter (Signed)
That is fine with me.

## 2019-08-24 MED ORDER — LOSARTAN POTASSIUM 50 MG PO TABS
50.0000 mg | ORAL_TABLET | Freq: Every day | ORAL | 3 refills | Status: DC
Start: 2019-08-24 — End: 2020-07-26

## 2019-08-24 NOTE — Telephone Encounter (Signed)
Spoke to patient Dr.Berry advised ok to take Losartan 50 mg daily.Prescription sent to pharmacy.

## 2019-08-26 LAB — LIPID PANEL
Chol/HDL Ratio: 3.2 ratio (ref 0.0–5.0)
Cholesterol, Total: 157 mg/dL (ref 100–199)
HDL: 49 mg/dL (ref >39)
LDL Chol Calc (NIH): 86 mg/dL (ref 0–99)
Triglycerides: 121 mg/dL (ref 0–149)
VLDL Cholesterol Cal: 22 mg/dL (ref 5–40)

## 2019-09-03 ENCOUNTER — Other Ambulatory Visit: Payer: Self-pay | Admitting: Dermatology

## 2019-09-03 MED ORDER — EUCRISA 2 % EX OINT: 1.0000 "application " | TOPICAL_OINTMENT | Freq: Every day | CUTANEOUS | 4 refills | Status: DC

## 2019-09-03 NOTE — Telephone Encounter (Signed)
Patient is calling to get refill on Eucrisa.  Patient has new insurance since starting original prescription, so needs new prescription.  Patient says pharmacy told him that per insurance he was going to have to do step therapy and he completed that earlier.  What does he need to do?  [facility] [address].  (Chart # [id number])

## 2019-09-03 NOTE — Telephone Encounter (Signed)
Phone call to patient to inform him that I've sent in the refill for his Eucrisa prescription to his Pharmacy.  Patient aware.

## 2019-09-04 ENCOUNTER — Other Ambulatory Visit: Payer: Self-pay

## 2019-09-04 DIAGNOSIS — I25810 Atherosclerosis of coronary artery bypass graft(s) without angina pectoris: Secondary | ICD-10-CM

## 2019-09-04 DIAGNOSIS — E782 Mixed hyperlipidemia: Secondary | ICD-10-CM

## 2019-09-04 MED ORDER — EZETIMIBE 10 MG PO TABS
10.0000 mg | ORAL_TABLET | Freq: Every day | ORAL | 3 refills | Status: DC
Start: 2019-09-04 — End: 2022-01-23

## 2019-09-14 ENCOUNTER — Other Ambulatory Visit: Payer: Self-pay | Admitting: *Deleted

## 2019-09-14 ENCOUNTER — Other Ambulatory Visit: Payer: Self-pay | Admitting: Dermatology

## 2019-09-14 MED ORDER — EUCRISA 2 % EX OINT: 1.0000 "application " | TOPICAL_OINTMENT | Freq: Every day | CUTANEOUS | 4 refills | Status: DC

## 2019-09-14 NOTE — Telephone Encounter (Signed)
Patient calling to let up now that the pharmacy states that they did not receive the Rx that we sent to the pharmacy on 7/22 for Eucrisa. He would like for our office to reach out to the pharmacy and make sure they get the Rx.

## 2019-09-14 NOTE — Telephone Encounter (Signed)
Left message for patient to call us back to confirm pharmacy.

## 2019-09-14 NOTE — Telephone Encounter (Signed)
Re sent in [facility] [address]. Patient notified.

## 2019-09-16 MED ORDER — EUCRISA 2 % EX OINT: 1.0000 "application " | TOPICAL_OINTMENT | Freq: Every day | CUTANEOUS | 4 refills | Status: AC

## 2019-09-16 NOTE — Addendum Note (Signed)
Addended by: Sheran Lawless on: 09/16/2019 12:28 PM   Modules accepted: Orders

## 2019-09-16 NOTE — Telephone Encounter (Signed)
PA completed for eucrisa via the phone approval # is  [id number]

## 2019-10-07 ENCOUNTER — Ambulatory Visit: Payer: 59 | Admitting: Family Medicine

## 2019-10-07 ENCOUNTER — Other Ambulatory Visit: Payer: Self-pay

## 2019-10-07 ENCOUNTER — Ambulatory Visit (INDEPENDENT_AMBULATORY_CARE_PROVIDER_SITE_OTHER): Payer: 59

## 2019-10-07 ENCOUNTER — Encounter: Payer: Self-pay | Admitting: Family Medicine

## 2019-10-07 VITALS — BP 120/94 | HR 64 | Ht 73.0 in | Wt 253.0 lb

## 2019-10-07 DIAGNOSIS — M7601 Gluteal tendinitis, right hip: Secondary | ICD-10-CM

## 2019-10-07 DIAGNOSIS — M542 Cervicalgia: Secondary | ICD-10-CM | POA: Diagnosis not present

## 2019-10-07 DIAGNOSIS — M25511 Pain in right shoulder: Secondary | ICD-10-CM

## 2019-10-07 NOTE — Patient Instructions (Signed)
Exercises See me again in 3 months

## 2019-10-07 NOTE — Progress Notes (Signed)
[facility] [place] Phone: [phone] Subjective:   [name], am serving as a scribe for Dr. Hulan Saas. This visit occurred during the SARS-CoV-2 public health emergency.  Safety protocols were in place, including screening questions prior to the visit, additional usage of staff PPE, and extensive cleaning of exam room while observing appropriate contact time as indicated for disinfecting solutions.  I'm seeing this patient by the request  of:  Vivi Barrack, MD  CC: Right shoulder pain, right hip pain  [id number]   07/08/2019 Significant improvement from previous injection.  At this point I think patient is doing well and can follow-up with me in 3 month intervals but I believe patient will do well with conservative therapy.  Update 10/07/2019 Quinn Quam is a 64 y.o. male coming in with complaint of shoulder pain and tingling. Patient states that his right shoulder pain in scapula. Tingling going down into right hand. Also complaining of cyst on dorsal aspect of right wrist.   Intermittent pain in right glute. Also having left glute pain that diminished with IBU use.  Patient has had injections in the gluteal tendon previously and has had good success.      Past Medical History:  Diagnosis Date  . Basal cell carcinoma 02/10/2019   sup-left post shoulder (CX35FU)  . Coronary artery disease   . Hyperlipidemia   . Hypertension   . Old MI (myocardial infarction) 2009   Past Surgical History:  Procedure Laterality Date  . CORONARY ANGIOPLASTY    . DENTAL SURGERY    . NASAL SINUS SURGERY    . VARICOSE VEIN SURGERY     Social History   Socioeconomic History  . Marital status: Married    Spouse name: Not on file  . Number of children: Not on file  . Years of education: Not on file  . Highest education level: Not on file  Occupational History  . Not on file  Tobacco Use  . Smoking status:  Former Research scientist (life sciences)  . Smokeless tobacco: Never Used  . Tobacco comment: 2009  Substance and Sexual Activity  . Alcohol use: Yes    Comment: 14  . Drug use: Never  . Sexual activity: Yes    Partners: Female  Other Topics Concern  . Not on file  Social History Narrative  . Not on file   Social Determinants of Health   Financial Resource Strain:   . Difficulty of Paying Living Expenses: Not on file  Food Insecurity:   . Worried About Charity fundraiser in the Last Year: Not on file  . Ran Out of Food in the Last Year: Not on file  Transportation Needs:   . Lack of Transportation (Medical): Not on file  . Lack of Transportation (Non-Medical): Not on file  Physical Activity:   . Days of Exercise per Week: Not on file  . Minutes of Exercise per Session: Not on file  Stress:   . Feeling of Stress : Not on file  Social Connections:   . Frequency of Communication with Friends and Family: Not on file  . Frequency of Social Gatherings with Friends and Family: Not on file  . Attends Religious Services: Not on file  . Active Member of Clubs or Organizations: Not on file  . Attends Archivist Meetings: Not on file  . Marital Status: Not on file   No Known Allergies Family History  Problem Relation Age  of Onset  . Prostate cancer Father   . Cancer Paternal Grandmother   . Heart disease Neg Hx      Current Outpatient Medications (Cardiovascular):  .  carvedilol (COREG) 6.25 MG tablet, Take 1 tablet (6.25 mg total) by mouth 2 (two) times daily. Marland Kitchen  ezetimibe (ZETIA) 10 MG tablet, Take 1 tablet (10 mg total) by mouth daily. Marland Kitchen  losartan (COZAAR) 50 MG tablet, Take 1 tablet (50 mg total) by mouth daily. Marland Kitchen  atorvastatin (LIPITOR) 80 MG tablet, Take 1 tablet (80 mg total) by mouth daily.   Current Outpatient Medications (Analgesics):  .  aspirin 81 MG tablet, Take 1 tablet (81 mg total) by mouth daily.   Current Outpatient Medications (Other):  Marland Kitchen  Coenzyme Q10 (COQ10) 100 MG  CAPS, Take 200 mg by mouth daily. Stasia Cavalier (EUCRISA) 2 % OINT, Apply 1 application topically daily. .  Multiple Vitamin (ONE DAILY) tablet, Take by mouth. .  zinc gluconate 50 MG tablet, Take by mouth.   Reviewed prior external information including notes and imaging from  primary care provider As well as notes that were available from care everywhere and other healthcare systems.  Past medical history, social, surgical and family history all reviewed in electronic medical record.  No pertanent information unless stated regarding to the chief complaint.   Review of Systems:  No headache, visual changes, nausea, vomiting, diarrhea, constipation, dizziness, abdominal pain, skin rash, fevers, chills, night sweats, weight loss, swollen lymph nodes, body aches, joint swelling, chest pain, shortness of breath, mood changes. POSITIVE muscle aches  Objective  Blood pressure (!) 120/94, pulse 64, height 6\' 1"  (1.854 m), weight 253 lb (114.8 kg), SpO2 96 %.   General: No apparent distress alert and oriented x3 mood and affect normal, dressed appropriately.  HEENT: Pupils equal, extraocular movements intact  Respiratory: Patient's speak in full sentences and does not appear short of breath  Cardiovascular: No lower extremity edema, non tender, no erythema  Neuro: Cranial nerves II through XII are intact, neurovascularly intact in all extremities with 2+ DTRs and 2+ pulses.  Gait normal with good balance and coordination.  MSK: Right shoulder shows the patient does have mild positive impingement.  Mild positive crossover.  4+ out of 5 strength of the rotator cuff noted.  Does have some very mild limitation in internal range of motion.  Right tenderness gluteal area positive FABER test.  Negative straight leg test.  Minimal discomfort in the back.  Limited musculoskeletal ultrasound of patient's right shoulder was independently visualized and performed by [name] Limited ultrasound shows  the patient does have may be some mild degenerative tearing of the rotator cuff but no true acute tear.  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  Minimal narrowing of the glenohumeral joint.  Procedure: Real-time Ultrasound Guided Injection of right glenohumeral joint Device: GE Logiq Q7  Ultrasound guided injection is preferred based studies that show increased duration, increased effect, greater accuracy, decreased procedural pain, increased response rate with ultrasound guided versus blind injection.  Verbal informed consent obtained.  Time-out conducted.  Noted no overlying erythema, induration, or other signs of local infection.  Skin prepped in a sterile fashion.  Local anesthesia: Topical Ethyl chloride.  With sterile technique and under real time ultrasound guidance:  Joint visualized.  23g 1  inch needle inserted posterior approach. Pictures taken for needle placement. Patient did have injection of 2 cc of 1% lidocaine, 2 cc of 0.5% Marcaine, and 1.0 cc  of Kenalog 40 mg/dL. Completed without difficulty  Pain immediately resolved suggesting accurate placement of the medication.  Advised to call if fevers/chills, erythema, induration, drainage, or persistent bleeding.  Images permanently stored and available for review in the ultrasound unit.  Impression: Technically successful ultrasound guided injection.  Procedure: Real-time Ultrasound Guided Injection of right gluteal tendon sheath Device: GE Logiq Q7 Ultrasound guided injection is preferred based studies that show increased duration, increased effect, greater accuracy, decreased procedural pain, increased response rate, and decreased cost with ultrasound guided versus blind injection.  Verbal informed consent obtained.  Time-out conducted.  Noted no overlying erythema, induration, or other signs of local infection.  Skin prepped in a sterile fashion.  Local anesthesia: Topical Ethyl chloride.  With sterile  technique and under real time ultrasound guidance: With a 21-gauge 2 inch needle injected with 0.5 cc of 0.5% Marcaine and 0.5 cc of Kenalog 40 mg/mL Completed without difficulty  Pain immediately resolved suggesting accurate placement of the medication.  Advised to call if fevers/chills, erythema, induration, drainage, or persistent bleeding.  Images permanently stored and available for review in the ultrasound unit.  Impression: Technically successful ultrasound guided injection.   Impression and Recommendations:     The above documentation has been reviewed and is accurate and complete [name], DO       Note: This dictation was prepared with Dragon dictation along with smaller phrase technology. Any transcriptional errors that result from this process are unintentional.

## 2019-10-09 ENCOUNTER — Encounter: Payer: Self-pay | Admitting: Family Medicine

## 2019-10-09 DIAGNOSIS — M25511 Pain in right shoulder: Secondary | ICD-10-CM | POA: Insufficient documentation

## 2019-10-09 NOTE — Assessment & Plan Note (Signed)
Nonspecific.  Patient does have some arthritic changes right shoulder greater than left.  Seems to be more secondary to may be some mild underlying arthritis.  Patient given home exercises and hopefully will be beneficial.  Likely will need ultrasound again at follow-up

## 2019-10-09 NOTE — Assessment & Plan Note (Signed)
Repeat injection given.  Tolerated the procedure well.  Increase activity slowly.  Discussed icing regimen and home exercises.  Patient will continue to work on core strengthening.  Discussed certain medications such as gabapentin which patient has had some difficulty previously.  Follow-up again in 4 to 8 weeks

## 2019-10-11 ENCOUNTER — Encounter: Payer: Self-pay | Admitting: Family Medicine

## 2019-10-12 MED ORDER — PREDNISONE 50 MG PO TABS
50.0000 mg | ORAL_TABLET | Freq: Every day | ORAL | 0 refills | Status: DC
Start: 2019-10-12 — End: 2020-02-10

## 2019-10-12 MED ORDER — TIZANIDINE HCL 4 MG PO TABS: 4.0000 mg | ORAL_TABLET | Freq: Every evening | ORAL | 2 refills | Status: AC

## 2019-10-13 ENCOUNTER — Ambulatory Visit: Payer: 59 | Admitting: Family Medicine

## 2020-01-11 ENCOUNTER — Ambulatory Visit: Payer: 59 | Admitting: Family Medicine

## 2020-02-10 ENCOUNTER — Encounter: Payer: Self-pay | Admitting: Family Medicine

## 2020-02-10 ENCOUNTER — Ambulatory Visit (INDEPENDENT_AMBULATORY_CARE_PROVIDER_SITE_OTHER): Payer: 59 | Admitting: Family Medicine

## 2020-02-10 ENCOUNTER — Other Ambulatory Visit: Payer: Self-pay

## 2020-02-10 VITALS — BP 166/73 | HR 69 | Temp 97.7°F | Ht 73.0 in | Wt 258.8 lb

## 2020-02-10 DIAGNOSIS — Z125 Encounter for screening for malignant neoplasm of prostate: Secondary | ICD-10-CM | POA: Diagnosis not present

## 2020-02-10 DIAGNOSIS — R739 Hyperglycemia, unspecified: Secondary | ICD-10-CM

## 2020-02-10 DIAGNOSIS — E782 Mixed hyperlipidemia: Secondary | ICD-10-CM | POA: Diagnosis not present

## 2020-02-10 DIAGNOSIS — Z23 Encounter for immunization: Secondary | ICD-10-CM | POA: Diagnosis not present

## 2020-02-10 DIAGNOSIS — Z0001 Encounter for general adult medical examination with abnormal findings: Secondary | ICD-10-CM

## 2020-02-10 DIAGNOSIS — I25810 Atherosclerosis of coronary artery bypass graft(s) without angina pectoris: Secondary | ICD-10-CM

## 2020-02-10 DIAGNOSIS — I10 Essential (primary) hypertension: Secondary | ICD-10-CM

## 2020-02-10 DIAGNOSIS — R059 Cough, unspecified: Secondary | ICD-10-CM

## 2020-02-10 DIAGNOSIS — Z6834 Body mass index (BMI) 34.0-34.9, adult: Secondary | ICD-10-CM

## 2020-02-10 DIAGNOSIS — E669 Obesity, unspecified: Secondary | ICD-10-CM

## 2020-02-10 LAB — CBC
HCT: 41.4 % (ref 39.0–52.0)
Hemoglobin: 14.2 g/dL (ref 13.0–17.0)
MCHC: 34.2 g/dL (ref 30.0–36.0)
MCV: 95.3 fl (ref 78.0–100.0)
Platelets: 241 10*3/uL (ref 150.0–400.0)
RBC: 4.34 Mil/uL (ref 4.22–5.81)
RDW: 13 % (ref 11.5–15.5)
WBC: 7.2 10*3/uL (ref 4.0–10.5)

## 2020-02-10 LAB — COMPREHENSIVE METABOLIC PANEL
ALT: 26 U/L (ref 0–53)
AST: 26 U/L (ref 0–37)
Albumin: 4.6 g/dL (ref 3.5–5.2)
Alkaline Phosphatase: 56 U/L (ref 39–117)
BUN: 21 mg/dL (ref 6–23)
CO2: 26 mEq/L (ref 19–32)
Calcium: 9.8 mg/dL (ref 8.4–10.5)
Chloride: 101 mEq/L (ref 96–112)
Creatinine, Ser: 1.05 mg/dL (ref 0.40–1.50)
GFR: 75.19 mL/min (ref >60.00)
Glucose, Bld: 99 mg/dL (ref 70–99)
Potassium: 4.3 mEq/L (ref 3.5–5.1)
Sodium: 138 mEq/L (ref 135–145)
Total Bilirubin: 0.7 mg/dL (ref 0.2–1.2)
Total Protein: 7.1 g/dL (ref 6.0–8.3)

## 2020-02-10 LAB — LIPID PANEL
Cholesterol: 181 mg/dL (ref 0–200)
HDL: 59.2 mg/dL (ref >39.00)
LDL Cholesterol: 84 mg/dL (ref 0–99)
NonHDL: 122.09
Total CHOL/HDL Ratio: 3
Triglycerides: 192 mg/dL — ABNORMAL HIGH (ref 0.0–149.0)
VLDL: 38.4 mg/dL (ref 0.0–40.0)

## 2020-02-10 LAB — PSA: PSA: 0.35 ng/mL (ref 0.10–4.00)

## 2020-02-10 LAB — SARS-COV-2 IGG: SARS-COV-2 IgG: 0.86

## 2020-02-10 LAB — TSH: TSH: 2.65 u[IU]/mL (ref 0.35–4.50)

## 2020-02-10 LAB — HEMOGLOBIN A1C: Hgb A1c MFr Bld: 5.4 % (ref 4.6–6.5)

## 2020-02-10 NOTE — Assessment & Plan Note (Signed)
Check lipids. Continue Lipitor. Did not tolerate study. Will be following up with cardiology next week.

## 2020-02-10 NOTE — Assessment & Plan Note (Signed)
Above goal today though typically well controlled. Continue Coreg 6.25 mg twice daily and losartan 50 mg daily.

## 2020-02-10 NOTE — Progress Notes (Signed)
Chief Complaint:  Randall Estes is a 64 y.o. male who presents today for his annual comprehensive physical exam.    Assessment/Plan:  Chronic Problems Addressed Today: Hyperlipidemia Check lipids. Continue Lipitor. Did not tolerate study. Will be following up with cardiology next week.  Essential hypertension Above goal today though typically well controlled. Continue Coreg 6.25 mg twice daily and losartan 50 mg daily.  CAD (coronary artery disease) of artery bypass graft  Continue aspirin and statin.   Body mass index is 34.14 kg/m. / Obese  BMI Metric Follow Up - 02/10/20 1003      BMI Metric Follow Up-Please document annually   BMI Metric Follow Up Education provided            Preventative Healthcare: Flu vaccine given today. Check labs including CBC, CMET, TSH, PSA, lipid panel.  Patient Counseling(The following topics were reviewed and/or handout was given):  -Nutrition: Stressed importance of moderation in sodium/caffeine intake, saturated fat and cholesterol, caloric balance, sufficient intake of fresh fruits, vegetables, and fiber.  -Stressed the importance of regular exercise.   -Substance Abuse: Discussed cessation/primary prevention of tobacco, alcohol, or other drug use; driving or other dangerous activities under the influence; availability of treatment for abuse.   -Injury prevention: Discussed safety belts, safety helmets, smoke detector, smoking near bedding or upholstery.   -Sexuality: Discussed sexually transmitted diseases, partner selection, use of condoms, avoidance of unintended pregnancy and contraceptive alternatives.   -Dental health: Discussed importance of regular tooth brushing, flossing, and dental visits.  -Health maintenance and immunizations reviewed. Please refer to Health maintenance section.  Return to care in 1 year for next preventative visit.     Subjective:  HPI:  He has no acute complaints today.   Lifestyle Diet: Trying to  get more fruits and vegetables.  Exercise: Limited.   Depression screen PHQ 2/9 02/10/2020  Decreased Interest 0  Down, Depressed, Hopeless 0  PHQ - 2 Score 0    Health Maintenance Due  Topic Date Due  . HIV Screening  Never done  . COVID-19 Vaccine (3 - Moderna risk 4-dose series) 06/17/2019     ROS: Per HPI, otherwise a complete review of systems was negative.   PMH:  The following were reviewed and entered/updated in epic: Past Medical History:  Diagnosis Date  . Basal cell carcinoma 02/10/2019   sup-left post shoulder (CX35FU)  . Coronary artery disease   . Hyperlipidemia   . Hypertension   . Old MI (myocardial infarction) 2009   Patient Active Problem List   Diagnosis Date Noted  . Right shoulder pain 10/09/2019  . Palpitations 07/14/2019  . Obstructive sleep apnea 07/14/2019  . Gluteal tendinitis of right buttock 05/12/2019  . 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 Essential hypertension 07/29/2015  . Hyperlipidemia 07/29/2015  . CAD (coronary artery disease) of artery bypass graft 07/29/2015  . MI, old 09/01/2014   Past Surgical History:  Procedure Laterality Date  . CORONARY ANGIOPLASTY    . DENTAL SURGERY    . NASAL SINUS SURGERY    . VARICOSE VEIN SURGERY      Family History  Problem Relation Age of Onset  . Prostate cancer Father   . Cancer Paternal Grandmother   . Heart disease Neg Hx     Medications- reviewed and updated Current Outpatient Medications  Medication Sig Dispense Refill  . aspirin 81 MG tablet Take 1 tablet (81 mg total) by mouth daily. 90 tablet 3  . carvedilol (COREG) 6.25 MG tablet Take  1 tablet (6.25 mg total) by mouth 2 (two) times daily. 180 tablet 3  . cholecalciferol (VITAMIN D3) 25 MCG (1000 UNIT) tablet Take 1,000 Units by mouth daily.    Stasia Cavalier (EUCRISA) 2 % OINT Apply 1 application topically daily. 100 g 4  . losartan (COZAAR) 50 MG tablet Take 1 tablet (50 mg total) by mouth daily. 90 tablet 3  . Multiple Vitamin (ONE  DAILY) tablet Take by mouth.    . thiamine (VITAMIN B-1) 50 MG tablet Take 50 mg by mouth daily.    Marland Kitchen zinc gluconate 50 MG tablet Take by mouth.    Marland Kitchen atorvastatin (LIPITOR) 80 MG tablet Take 1 tablet (80 mg total) by mouth daily. 90 tablet 3  . Coenzyme Q10 (COQ10) 100 MG CAPS Take 200 mg by mouth daily. (Patient not taking: Reported on 02/10/2020) 90 capsule 3  . ezetimibe (ZETIA) 10 MG tablet Take 1 tablet (10 mg total) by mouth daily. 90 tablet 3   No current facility-administered medications for this visit.    Allergies-reviewed and updated No Known Allergies  Social History   Socioeconomic History  . Marital status: Married    Spouse name: Not on file  . Number of children: Not on file  . Years of education: Not on file  . Highest education level: Not on file  Occupational History  . Not on file  Tobacco Use  . Smoking status: Former Research scientist (life sciences)  . Smokeless tobacco: Never Used  . Tobacco comment: 2009  Substance and Sexual Activity  . Alcohol use: Yes    Comment: 14  . Drug use: Never  . Sexual activity: Yes    Partners: Female  Other Topics Concern  . Not on file  Social History Narrative  . Not on file   Social Determinants of Health   Financial Resource Strain: Not on file  Food Insecurity: Not on file  Transportation Needs: Not on file  Physical Activity: Not on file  Stress: Not on file  Social Connections: Not on file        Objective:  Physical Exam: BP (!) 166/73   Pulse 69   Temp 97.7 F (36.5 C) (Temporal)   Ht 6\' 1"  (1.854 m)   Wt 258 lb 12.8 oz (117.4 kg)   SpO2 97%   BMI 34.14 kg/m   Body mass index is 34.14 kg/m. Wt Readings from Last 3 Encounters:  02/10/20 258 lb 12.8 oz (117.4 kg)  10/07/19 253 lb (114.8 kg)  07/14/19 255 lb (115.7 kg)   Gen: NAD, resting comfortably HEENT: TMs normal bilaterally. OP clear. No thyromegaly noted.  CV: RRR with no murmurs appreciated Pulm: NWOB, CTAB with no crackles, wheezes, or rhonchi GI:  Normal bowel sounds present. Soft, Nontender, Nondistended. MSK: no edema, cyanosis, or clubbing noted Skin: warm, dry Neuro: CN2-12 grossly intact. Strength 5/5 in upper and lower extremities. Reflexes symmetric and intact bilaterally.  Psych: Normal affect and thought content     Daxter Paule M. Jerline Pain, MD 02/10/2020 10:03 AM

## 2020-02-10 NOTE — Assessment & Plan Note (Signed)
Continue aspirin and statin. 

## 2020-02-10 NOTE — Patient Instructions (Addendum)
It was very nice to see you today!  We will give your flu vaccine today.  We will check blood work today.  No changes to your medication regimen.  I will see back in year for your next annual physical.  Please come back to see me sooner if needed.  Take care, Dr Jerline Pain  Please try these tips to maintain a healthy lifestyle:   Eat at least 3 REAL meals and 1-2 snacks per day.  Aim for no more than 5 hours between eating.  If you eat breakfast, please do so within one hour of getting up.    Each meal should contain half fruits/vegetables, one quarter protein, and one quarter carbs (no bigger than a computer mouse)   Cut down on sweet beverages. This includes juice, soda, and sweet tea.     Drink at least 1 glass of water with each meal and aim for at least 8 glasses per day   Exercise at least 150 minutes every week.    Preventive Care 2-84 Years Old, Male Preventive care refers to lifestyle choices and visits with your health care provider that can promote health and wellness. This includes:  A yearly physical exam. This is also called an annual well check.  Regular dental and eye exams.  Immunizations.  Screening for certain conditions.  Healthy lifestyle choices, such as eating a healthy diet, getting regular exercise, not using drugs or products that contain nicotine and tobacco, and limiting alcohol use. What can I expect for my preventive care visit? Physical exam Your health care provider will check:  Height and weight. These may be used to calculate body mass index (BMI), which is a measurement that tells if you are at a healthy weight.  Heart rate and blood pressure.  Your skin for abnormal spots. Counseling Your health care provider may ask you questions about:  Alcohol, tobacco, and drug use.  Emotional well-being.  Home and relationship well-being.  Sexual activity.  Eating habits.  Work and work Statistician. What immunizations do I  need?  Influenza (flu) vaccine  This is recommended every year. Tetanus, diphtheria, and pertussis (Tdap) vaccine  You may need a Td booster every 10 years. Varicella (chickenpox) vaccine  You may need this vaccine if you have not already been vaccinated. Zoster (shingles) vaccine  You may need this after age 44. Measles, mumps, and rubella (MMR) vaccine  You may need at least one dose of MMR if you were born in 1957 or later. You may also need a second dose. Pneumococcal conjugate (PCV13) vaccine  You may need this if you have certain conditions and were not previously vaccinated. Pneumococcal polysaccharide (PPSV23) vaccine  You may need one or two doses if you smoke cigarettes or if you have certain conditions. Meningococcal conjugate (MenACWY) vaccine  You may need this if you have certain conditions. Hepatitis A vaccine  You may need this if you have certain conditions or if you travel or work in places where you may be exposed to hepatitis A. Hepatitis B vaccine  You may need this if you have certain conditions or if you travel or work in places where you may be exposed to hepatitis B. Haemophilus influenzae type b (Hib) vaccine  You may need this if you have certain risk factors. Human papillomavirus (HPV) vaccine  If recommended by your health care provider, you may need three doses over 6 months. You may receive vaccines as individual doses or as more than one vaccine together  in one shot (combination vaccines). Talk with your health care provider about the risks and benefits of combination vaccines. What tests do I need? Blood tests  Lipid and cholesterol levels. These may be checked every 5 years, or more frequently if you are over 58 years old.  Hepatitis C test.  Hepatitis B test. Screening  Lung cancer screening. You may have this screening every year starting at age 3 if you have a 30-pack-year history of smoking and currently smoke or have quit within  the past 15 years.  Prostate cancer screening. Recommendations will vary depending on your family history and other risks.  Colorectal cancer screening. All adults should have this screening starting at age 30 and continuing until age 51. Your health care provider may recommend screening at age 54 if you are at increased risk. You will have tests every 1-10 years, depending on your results and the type of screening test.  Diabetes screening. This is done by checking your blood sugar (glucose) after you have not eaten for a while (fasting). You may have this done every 1-3 years.  Sexually transmitted disease (STD) testing. Follow these instructions at home: Eating and drinking  Eat a diet that includes fresh fruits and vegetables, whole grains, lean protein, and low-fat dairy products.  Take vitamin and mineral supplements as recommended by your health care provider.  Do not drink alcohol if your health care provider tells you not to drink.  If you drink alcohol: ? Limit how much you have to 0-2 drinks a day. ? Be aware of how much alcohol is in your drink. In the [place] one drink equals one 12 oz bottle of beer (355 mL), one 5 oz glass of wine (148 mL), or one 1 oz glass of hard liquor (44 mL). Lifestyle  Take daily care of your teeth and gums.  Stay active. Exercise for at least 30 minutes on 5 or more days each week.  Do not use any products that contain nicotine or tobacco, such as cigarettes, e-cigarettes, and chewing tobacco. If you need help quitting, ask your health care provider.  If you are sexually active, practice safe sex. Use a condom or other form of protection to prevent STIs (sexually transmitted infections).  Talk with your health care provider about taking a low-dose aspirin every day starting at age 73. What's next?  Go to your health care provider once a year for a well check visit.  Ask your health care provider how often you should have your eyes and teeth  checked.  Stay up to date on all vaccines. This information is not intended to replace advice given to you by your health care provider. Make sure you discuss any questions you have with your health care provider. Document Revised: 01/23/2018 Document Reviewed: 01/23/2018 Elsevier Patient Education  2020 [employer].

## 2020-02-15 NOTE — Progress Notes (Signed)
Cardiology Clinic Note   Patient Name: Randall Estes Date of Encounter: 02/16/2020  Primary Care Provider:  Vivi Barrack, MD Primary Cardiologist:  Quay Burow, MD  Patient Profile    Randall Estes 65 year old male presents the clinic today for follow-up evaluation of his palpitations and HLD.  Past Medical History    Past Medical History:  Diagnosis Date  . Basal cell carcinoma 02/10/2019   sup-left post shoulder (CX35FU)  . Coronary artery disease   . Hyperlipidemia   . Hypertension   . Old MI (myocardial infarction) 2009   Past Surgical History:  Procedure Laterality Date  . CORONARY ANGIOPLASTY    . DENTAL SURGERY    . NASAL SINUS SURGERY    . VARICOSE VEIN SURGERY      Allergies  No Known Allergies  History of Present Illness   Randall Estes is a PMH of coronary artery disease (STEMI 08/16/2007 in [place] with stenting x2 to his LAD).  Preserved LV function, physically active working out the gym 3 times per week and doing cardiovascular workouts for 45 minutes bouts without symptoms.  Has PMH also includes hypertension, hyperlipidemia, OSA.  He was started on losartan by his PCP.  It was noted that his losartan caused palpitations.  He was also consuming 3-4 cups of coffee in the morning.  His losartan was cut in half and his palpitations improved.  He presents the clinic today for follow-up evaluation states he feels well.  He continues to be very physically active working out doing both aerobic and anaerobic activities multiple days per week.  He has not noticed any further episodes of palpitations and states his diet has been poor over the holidays.  He is now working on eating a more heart healthy diet.  He has been monitoring his blood pressure at home and reports systolic blood pressures in the 130s-120s over 70s.  He reports myalgias on increased dose of atorvastatin and is now taking 4 mg.  I recommended he see clinical pharmacist for lipid  evaluation.  He wishes to defer at this time and work on improving his diet.  I will give him the salty 6 diet sheet, reviewed increasing fiber in his diet, will have him maintain a blood pressure log and follow-up in 6 months.  Today he denies chest pain, shortness of breath, lower extremity edema, fatigue, palpitations, melena, hematuria, hemoptysis, diaphoresis, weakness, presyncope, syncope, orthopnea, and PND.   Home Medications    Prior to Admission medications   Medication Sig Start Date End Date Taking? Authorizing Provider  aspirin 81 MG tablet Take 1 tablet (81 mg total) by mouth daily. 07/29/15   Lorretta Harp, MD  atorvastatin (LIPITOR) 80 MG tablet Take 1 tablet (80 mg total) by mouth daily. 06/26/19 09/24/19  Lorretta Harp, MD  carvedilol (COREG) 6.25 MG tablet Take 1 tablet (6.25 mg total) by mouth 2 (two) times daily. 06/19/19   Lorretta Harp, MD  cholecalciferol (VITAMIN D3) 25 MCG (1000 UNIT) tablet Take 1,000 Units by mouth daily.    [provider]  Coenzyme Q10 (COQ10) 100 MG CAPS Take 200 mg by mouth daily. Patient not taking: Reported on 02/10/2020 10/31/18   Lorretta Harp, MD  Crisaborole (EUCRISA) 2 % OINT Apply 1 application topically daily. 09/16/19   Lavonna Monarch, MD  ezetimibe (ZETIA) 10 MG tablet Take 1 tablet (10 mg total) by mouth daily. 09/04/19 12/03/19  Lorretta Harp, MD  losartan (COZAAR) 50 MG tablet Take  1 tablet (50 mg total) by mouth daily. 08/24/19 11/22/19  Runell Gess, MD  Multiple Vitamin (ONE DAILY) tablet Take by mouth.    [provider]  thiamine (VITAMIN B-1) 50 MG tablet Take 50 mg by mouth daily.    [provider]  zinc gluconate 50 MG tablet Take by mouth.    [provider]    Family History    Family History  Problem Relation Age of Onset  . Prostate cancer Father   . Cancer Paternal Grandmother   . Heart disease Neg Hx    He indicated that his mother is alive. He indicated that  his father is alive. He indicated that the status of his paternal grandmother is unknown. He indicated that the status of his neg hx is unknown.  Social History    Social History   Socioeconomic History  . Marital status: Married    Spouse name: Not on file  . Number of children: Not on file  . Years of education: Not on file  . Highest education level: Not on file  Occupational History  . Not on file  Tobacco Use  . Smoking status: Former Games developer  . Smokeless tobacco: Never Used  . Tobacco comment: 2009  Substance and Sexual Activity  . Alcohol use: Yes    Comment: 14  . Drug use: Never  . Sexual activity: Yes    Partners: Female  Other Topics Concern  . Not on file  Social History Narrative  . Not on file   Social Determinants of Health   Financial Resource Strain: Not on file  Food Insecurity: Not on file  Transportation Needs: Not on file  Physical Activity: Not on file  Stress: Not on file  Social Connections: Not on file  Intimate Partner Violence: Not on file     Review of Systems    General:  No chills, fever, night sweats or weight changes.  Cardiovascular:  No chest pain, dyspnea on exertion, edema, orthopnea, palpitations, paroxysmal nocturnal dyspnea. Dermatological: No rash, lesions/masses Respiratory: No cough, dyspnea Urologic: No hematuria, dysuria Abdominal:   No nausea, vomiting, diarrhea, bright red blood per rectum, melena, or hematemesis Neurologic:  No visual changes, wkns, changes in mental status. All other systems reviewed and are otherwise negative except as noted above.  Physical Exam    VS:  BP (!) 153/91 (BP Location: Left Arm, Patient Position: Sitting, Cuff Size: Large)   Pulse 73   SpO2 96%  , BMI There is no height or weight on file to calculate BMI. GEN: Well nourished, well developed, in no acute distress. HEENT: normal. Neck: Supple, no JVD, carotid bruits, or masses. Cardiac: RRR, no murmurs, rubs, or gallops. No clubbing,  cyanosis, edema.  Radials/DP/PT 2+ and equal bilaterally.  Respiratory:  Respirations regular and unlabored, clear to auscultation bilaterally. GI: Soft, nontender, nondistended, BS + x 4. MS: no deformity or atrophy. Skin: warm and dry, no rash. Neuro:  Strength and sensation are intact. Psych: Normal affect.  Accessory Clinical Findings    Recent Labs: 02/10/2020: ALT 26; BUN 21; Creatinine, Ser 1.05; Hemoglobin 14.2; Platelets 241.0; Potassium 4.3; Sodium 138; TSH 2.65   Recent Lipid Panel    Component Value Date/Time   CHOL 181 02/10/2020 0957   CHOL 157 08/26/2019 0829   TRIG 192.0 (H) 02/10/2020 0957   HDL 59.20 02/10/2020 0957   HDL 49 08/26/2019 0829   CHOLHDL 3 02/10/2020 0957   VLDL 38.4 02/10/2020 0957  [place] 84 02/10/2020 0957   LDLCALC 86 08/26/2019 0829    ECG personally reviewed by me today-none today.- No acute changes   Cardiac event monitor 08/10/2019 1. SR/SB 2. Occassional PVCs 3. Short runs of NSVT  Assessment & Plan   1.  Palpitations-much better controlled on decreased dose of losartan.  PCP has started him on losartan which was contributing to palpitations.  Dose was then decreased which helped with palpitations. Maintain p.o. hydration Avoid triggers caffeine, chocolate, EtOH, dehydration etc. Maintain physical activity  Hyperlipidemia-02/10/2020: Cholesterol 181; HDL 59.20; LDL Cholesterol 84; Triglycerides 192.0; VLDL 38.4 he was not able to tolerate 80 mg daily of atorvastatin and has reduced back to 40 mg daily.  I recommended he see clinic pharmacist in the lipid clinic however, he wishes to defer at this time and continue to work on his diet. Continue atorvastatin Heart healthy low-sodium high-fiber diet. Increase physical activity as tolerated  Obstructive sleep apnea-resolved.  Underwent uvulopalatopharyngoplasty more than 20 years ago.  Reports restful sleep.  Disposition: Follow-up with Dr. Gwenlyn Found in 6 months.  Randall Ng. Willoughby Doell  NP-C    02/16/2020, 9:07 AM [address] Office [phone] Fax [phone]  Notice: This dictation was prepared with Dragon dictation along with smaller phrase technology. Any transcriptional errors that result from this process are unintentional and may not be corrected upon review.

## 2020-02-16 ENCOUNTER — Other Ambulatory Visit: Payer: Self-pay

## 2020-02-16 ENCOUNTER — Ambulatory Visit: Payer: 59 | Admitting: General Practice

## 2020-02-16 ENCOUNTER — Encounter: Payer: Self-pay | Admitting: General Practice

## 2020-02-16 VITALS — BP 153/91 | HR 73

## 2020-02-16 DIAGNOSIS — E782 Mixed hyperlipidemia: Secondary | ICD-10-CM | POA: Diagnosis not present

## 2020-02-16 DIAGNOSIS — R002 Palpitations: Secondary | ICD-10-CM | POA: Diagnosis not present

## 2020-02-16 DIAGNOSIS — G4733 Obstructive sleep apnea (adult) (pediatric): Secondary | ICD-10-CM | POA: Diagnosis not present

## 2020-02-16 NOTE — Patient Instructions (Addendum)
Medication Instructions:  The current medical regimen is effective;  continue present plan and medications as directed. Please refer to the Current Medication list given to you today. *If you need a refill on your cardiac medications before your next appointment, please call your pharmacy*  Lab Work:   Testing/Procedures:  NONE    NONE  Special Instructions PLEASE READ AND FOLLOW INCREASE FIBER DIET-ATTACHED  PLEASE READ AND FOLLOW SALTY 6-ATTACHED-1,800mg  daily  PLEASE CONTINUE PHYSICAL ACTIVITY AS TOLERATED  TAKE AND LOG YOUR BLOOD PRESSURE AT LEASE TWICE A WEEK AND WHEN YOU FEEL DIFFERENT.  Follow-Up: Your next appointment:  6 month(s) In Person with Quay Burow, MD OR IF UNAVAILABLE [place], FNP-C  At [facility], you and your health needs are our priority.  As part of our continuing mission to provide you with exceptional heart care, we have created designated Provider Care Teams.  These Care Teams include your primary Cardiologist (physician) and Advanced Practice Providers (APPs -  Physician Assistants and Nurse Practitioners) who all work together to provide you with the care you need, when you need it.            6 SALTY THINGS TO AVOID     1,800MG  DAILY     High-Fiber Diet Fiber, also called dietary fiber, is a type of carbohydrate that is found in fruits, vegetables, whole grains, and beans. A high-fiber diet can have many health benefits. Your health care provider may recommend a high-fiber diet to help:  Prevent constipation. Fiber can make your bowel movements more regular.  Lower your cholesterol.  Relieve the following conditions: ? Swelling of veins in the anus (hemorrhoids). ? Swelling and irritation (inflammation) of specific areas of the digestive tract (uncomplicated diverticulosis). ? A problem of the large intestine (colon) that sometimes causes pain and diarrhea (irritable bowel syndrome, IBS).  Prevent overeating as part of a weight-loss  plan.  Prevent heart disease, type 2 diabetes, and certain cancers. What is my plan? The recommended daily fiber intake in grams (g) includes:  38 g for men age 17 or younger.  30 g for men over age 5.  50 g for women age 44 or younger.  21 g for women over age 36. You can get the recommended daily intake of dietary fiber by:  Eating a variety of fruits, vegetables, grains, and beans.  Taking a fiber supplement, if it is not possible to get enough fiber through your diet. What do I need to know about a high-fiber diet?  It is better to get fiber through food sources rather than from fiber supplements. There is not a lot of research about how effective supplements are.  Always check the fiber content on the nutrition facts label of any prepackaged food. Look for foods that contain 5 g of fiber or more per serving.  Talk with a diet and nutrition specialist (dietitian) if you have questions about specific foods that are recommended or not recommended for your medical condition, especially if those foods are not listed below.  Gradually increase how much fiber you consume. If you increase your intake of dietary fiber too quickly, you may have bloating, cramping, or gas.  Drink plenty of water. Water helps you to digest fiber. What are tips for following this plan?  Eat a wide variety of high-fiber foods.  Make sure that half of the grains that you eat each day are whole grains.  Eat breads and cereals that are made with whole-grain flour instead of  refined flour or white flour.  Eat brown rice, bulgur wheat, or millet instead of white rice.  Start the day with a breakfast that is high in fiber, such as a cereal that contains 5 g of fiber or more per serving.  Use beans in place of meat in soups, salads, and pasta dishes.  Eat high-fiber snacks, such as berries, raw vegetables, nuts, and popcorn.  Choose whole fruits and vegetables instead of processed forms like juice or  sauce. What foods can I eat?  Fruits Berries. Pears. Apples. Oranges. Avocado. Prunes and raisins. Dried figs. Vegetables Sweet potatoes. Spinach. Kale. Artichokes. Cabbage. Broccoli. Cauliflower. Green peas. Carrots. Squash. Grains Whole-grain breads. Multigrain cereal. Oats and oatmeal. Brown rice. Barley. Bulgur wheat. Millet. Quinoa. Bran muffins. Popcorn. Rye wafer crackers. Meats and other proteins Navy, kidney, and pinto beans. Soybeans. Split peas. Lentils. Nuts and seeds. Dairy Fiber-fortified yogurt. Beverages Fiber-fortified soy milk. Fiber-fortified orange juice. Other foods Fiber bars. The items listed above may not be a complete list of recommended foods and beverages. Contact a dietitian for more options. What foods are not recommended? Fruits Fruit juice. Cooked, strained fruit. Vegetables Fried potatoes. Canned vegetables. Well-cooked vegetables. Grains White bread. Pasta made with refined flour. White rice. Meats and other proteins Fatty cuts of meat. Fried chicken or fried fish. Dairy Milk. Yogurt. Cream cheese. Sour cream. Fats and oils Butters. Beverages Soft drinks. Other foods Cakes and pastries. The items listed above may not be a complete list of foods and beverages to avoid. Contact a dietitian for more information. Summary  Fiber is a type of carbohydrate. It is found in fruits, vegetables, whole grains, and beans.  There are many health benefits of eating a high-fiber diet, such as preventing constipation, lowering blood cholesterol, helping with weight loss, and reducing your risk of heart disease, diabetes, and certain cancers.  Gradually increase your intake of fiber. Increasing too fast can result in cramping, bloating, and gas. Drink plenty of water while you increase your fiber.  The best sources of fiber include whole fruits and vegetables, whole grains, nuts, seeds, and beans. This information is not intended to replace advice given to  you by your health care provider. Make sure you discuss any questions you have with your health care provider. Document Revised: 12/03/2016 Document Reviewed: 12/03/2016 Elsevier Patient Education  2020 [employer].

## 2020-02-16 NOTE — Progress Notes (Signed)
Please inform patient of the following:  His blood work is all stable. His covid antibody test is inconclusive. Do not need to make any changes to his treatment plan at this time. Would like for him to keep working on diet and exercise and we can recheck in a year.  Katina Degree. Jimmey Ralph, MD 02/16/2020 9:24 AM

## 2020-02-26 ENCOUNTER — Encounter: Payer: Self-pay | Admitting: Physician Assistant

## 2020-02-26 ENCOUNTER — Telehealth (INDEPENDENT_AMBULATORY_CARE_PROVIDER_SITE_OTHER): Payer: 59 | Admitting: Physician Assistant

## 2020-02-26 VITALS — Ht 73.0 in | Wt 245.0 lb

## 2020-02-26 DIAGNOSIS — F418 Other specified anxiety disorders: Secondary | ICD-10-CM | POA: Diagnosis not present

## 2020-02-26 DIAGNOSIS — U071 COVID-19: Secondary | ICD-10-CM

## 2020-02-26 LAB — NOVEL CORONAVIRUS, NAA: SARS-CoV-2, NAA: POSITIVE

## 2020-02-26 MED ORDER — ALBUTEROL SULFATE (2.5 MG/3ML) 0.083% IN NEBU: 2.5000 mg | INHALATION_SOLUTION | Freq: Four times a day (QID) | RESPIRATORY_TRACT | 1 refills | Status: DC | PRN

## 2020-02-26 MED ORDER — LORAZEPAM 0.5 MG PO TABS: 0.5000 mg | ORAL_TABLET | Freq: Every day | ORAL | 0 refills | Status: DC | PRN

## 2020-02-26 NOTE — Progress Notes (Signed)
Virtual Visit via Video   I connected with Randall Estes on 02/26/20 at 12:00 PM EST by a video enabled telemedicine application and verified that I am speaking with the correct person using two identifiers. Location patient: Home Location provider: [facility], Office Persons participating in the virtual visit: Randall Estes, [place] PA-C  I discussed the limitations of evaluation and management by telemedicine and the availability of in person appointments. The patient expressed understanding and agreed to proceed.   Subjective:   HPI:   Patient is requesting evaluation for possible COVID-19.  Symptom onset: Last Friday 02/19/2020  Travel/contacts: No  Vaccination status: 2 shots  Testing results: Rapid test done on Wed was positive; has PCR scheduled tomorrow  Patient endorses the following symptoms: Coughing and expectorating clear sputum, chest tightness Situational anxiety -- including pacing around the room, insomnia Fever 101 Sunday thru Monday  Patient denies the following symptoms: Chest pain/SOB -- actually saw cardiology for f/u 10 days ago Weakness Malaise Poor appetite  Treatments tried: Mucinex  Patient risk factors: Current [id number] risk of complications score: 5 Smoking status: Randall Estes  reports that he has quit smoking. He has never used smokeless tobacco. If male, currently pregnant? []   Yes []   No  ROS: See pertinent positives and negatives per HPI.  Patient Active Problem List   Diagnosis Date Noted  . Right shoulder pain 10/09/2019  . Palpitations 07/14/2019  . Obstructive sleep apnea 07/14/2019  . Gluteal tendinitis of right buttock 05/12/2019  . 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 Essential hypertension 07/29/2015  . Hyperlipidemia 07/29/2015  . CAD (coronary artery disease) of artery bypass graft 07/29/2015  . MI, old 09/01/2014    Social History   Tobacco Use  . Smoking status: Former Research scientist (life sciences)  . Smokeless  tobacco: Never Used  . Tobacco comment: 2009  Substance Use Topics  . Alcohol use: Yes    Comment: 14    Current Outpatient Medications:  .  albuterol (PROVENTIL) (2.5 MG/3ML) 0.083% nebulizer solution, Take 3 mLs (2.5 mg total) by nebulization every 6 (six) hours as needed for wheezing or shortness of breath., Disp: 150 mL, Rfl: 1 .  aspirin 81 MG tablet, Take 1 tablet (81 mg total) by mouth daily., Disp: 90 tablet, Rfl: 3 .  atorvastatin (LIPITOR) 40 MG tablet, Take 40 mg by mouth daily., Disp: , Rfl:  .  carvedilol (COREG) 6.25 MG tablet, Take 1 tablet (6.25 mg total) by mouth 2 (two) times daily., Disp: 180 tablet, Rfl: 3 .  cholecalciferol (VITAMIN D3) 25 MCG (1000 UNIT) tablet, Take 1,000 Units by mouth daily., Disp: , Rfl:  .  Coenzyme Q10 (COQ10) 100 MG CAPS, Take 200 mg by mouth daily., Disp: 90 capsule, Rfl: 3 .  Crisaborole (EUCRISA) 2 % OINT, Apply 1 application topically daily., Disp: 100 g, Rfl: 4 .  LORazepam (ATIVAN) 0.5 MG tablet, Take 1 tablet (0.5 mg total) by mouth daily as needed for anxiety., Disp: 20 tablet, Rfl: 0 .  Multiple Vitamin (ONE DAILY) tablet, Take by mouth., Disp: , Rfl:  .  thiamine (VITAMIN B-1) 50 MG tablet, Take 50 mg by mouth daily., Disp: , Rfl:  .  zinc gluconate 50 MG tablet, Take by mouth., Disp: , Rfl:  .  ezetimibe (ZETIA) 10 MG tablet, Take 1 tablet (10 mg total) by mouth daily., Disp: 90 tablet, Rfl: 3 .  losartan (COZAAR) 50 MG tablet, Take 1 tablet (50 mg total) by mouth daily., Disp: 90 tablet, Rfl: 3  No Known Allergies  Objective:   VITALS: Per patient if applicable, see vitals. GENERAL: Alert, appears well and in no acute distress. HEENT: Atraumatic, conjunctiva clear, no obvious abnormalities on inspection of external nose and ears. NECK: Normal movements of the head and neck. CARDIOPULMONARY: No increased WOB. Speaking in clear sentences. I:E ratio WNL.  MS: Moves all visible extremities without noticeable abnormality. PSYCH:  Pleasant and cooperative, well-groomed. Speech normal rate and rhythm. Affect is appropriate. Insight and judgement are appropriate. Attention is focused, linear, and appropriate.  NEURO: CN grossly intact. Oriented as arrived to appointment on time with no prompting. Moves both UE equally.  SKIN: No obvious lesions, wounds, erythema, or cyanosis noted on face or hands.  Assessment and Plan:   Randall Estes was seen today for covid positive and anxiety.  Diagnoses and all orders for this visit:  COVID-19  Situational anxiety  Other orders -     LORazepam (ATIVAN) 0.5 MG tablet; Take 1 tablet (0.5 mg total) by mouth daily as needed for anxiety. -     albuterol (PROVENTIL) (2.5 MG/3ML) 0.083% nebulizer solution; Take 3 mLs (2.5 mg total) by nebulization every 6 (six) hours as needed for wheezing or shortness of breath.    No red flags on discussion, patient is not in any obvious distress during our visit. Discussed progression of most viral illnesses, and recommended supportive care at this point in time.   Will trial prn albuterol for his chest tightness. I discussed that if he has lack of improvement or any worsening/changes, needs to call cardiology to make sure there is no other issue going on. Small script of ativan to use prn for situational anxiety at this time. Recommend lowest dose possible and if this is ineffective or needs other remedies for anxiety to schedule f/u with PCP.  Discussed over the counter supportive care options, including Tylenol 500 mg q 8 hours, with recommendations to push fluids and rest. Reviewed return precautions including new/worsening fever, SOB, new/worsening cough, sudden onset changes of symptoms. Recommended need to self-quarantine and practice social distancing until symptoms resolve. I recommend that patient follow-up if symptoms worsen or persist despite treatment x 7-10 days, sooner if needed.  I discussed the assessment and treatment plan with the  patient. The patient was provided an opportunity to ask questions and all were answered. The patient agreed with the plan and demonstrated an understanding of the instructions.   The patient was advised to call back or seek an in-person evaluation if the symptoms worsen or if the condition fails to improve as anticipated.   [place], [place] 02/26/2020

## 2020-03-14 ENCOUNTER — Ambulatory Visit: Payer: 59 | Admitting: Family Medicine

## 2020-03-14 ENCOUNTER — Other Ambulatory Visit: Payer: Self-pay

## 2020-03-14 DIAGNOSIS — F410 Panic disorder [episodic paroxysmal anxiety] without agoraphobia: Secondary | ICD-10-CM

## 2020-03-14 MED ORDER — LORAZEPAM 1 MG PO TABS: 0.5000 mg | ORAL_TABLET | Freq: Two times a day (BID) | ORAL | 1 refills | Status: DC | PRN

## 2020-03-14 MED ORDER — CITALOPRAM HYDROBROMIDE 20 MG PO TABS: 20.0000 mg | ORAL_TABLET | Freq: Every day | ORAL | 3 refills | Status: DC

## 2020-03-14 NOTE — Progress Notes (Signed)
   Randall Estes is a 65 y.o. male who presents today for an office visit.  Assessment/Plan:  Chronic Problems Addressed Today: Panic attack Not controlled.  Possibly sequela of COVID-19 infection.  Will refill Ativan today.  Also start daily Celexa 20 mg daily.  Discussed potential side effects.  He will check with me in a couple weeks via MyChart.  Hopefully will only need to be on medications for a few months.    Subjective:  HPI:  Patient here for concern for anxiety and panic attacks.  He was diagnosed with Covid about 3 weeks ago.  Few days into his illness had a panic attack.  He has not had panic attacks for the past 20 years or so.  He has had persistent panic several days per week for the last few weeks.  He feels extremely claustrophobic when this happens.  He has been taking Ativan which is helped.He has never been on any antianxiety medications in the past.       Objective:  Physical Exam: BP (!) 169/67   Pulse 70   Temp 98.2 F (36.8 C) (Temporal)   Ht 6\' 1"  (1.854 m)   Wt 252 lb 12.8 oz (114.7 kg)   SpO2 96%   BMI 33.35 kg/m   Gen: No acute distress, resting comfortably CV: Regular rate and rhythm with no murmurs appreciated Pulm: Normal work of breathing, clear to auscultation bilaterally with no crackles, wheezes, or rhonchi Neuro: Grossly normal, moves all extremities Psych: Normal affect and thought content      Kiasia Chou M. Jerline Pain, MD 03/14/2020 2:15 PM

## 2020-03-14 NOTE — Assessment & Plan Note (Signed)
Not controlled.  Possibly sequela of COVID-19 infection.  Will refill Ativan today.  Also start daily Celexa 20 mg daily.  Discussed potential side effects.  He will check with me in a couple weeks via MyChart.  Hopefully will only need to be on medications for a few months.

## 2020-03-14 NOTE — Patient Instructions (Signed)
It was very nice to see you today!  I will refill your Ativan.  Please take twice daily as needed.  Also start Celexa.  Please send me a message in a couple weeks to let me know how you are doing.  Take care, Dr Jerline Pain  Please try these tips to maintain a healthy lifestyle:   Eat at least 3 REAL meals and 1-2 snacks per day.  Aim for no more than 5 hours between eating.  If you eat breakfast, please do so within one hour of getting up.    Each meal should contain half fruits/vegetables, one quarter protein, and one quarter carbs (no bigger than a computer mouse)   Cut down on sweet beverages. This includes juice, soda, and sweet tea.     Drink at least 1 glass of water with each meal and aim for at least 8 glasses per day   Exercise at least 150 minutes every week.

## 2020-04-05 ENCOUNTER — Other Ambulatory Visit: Payer: Self-pay | Admitting: Family Medicine

## 2020-05-13 NOTE — Progress Notes (Signed)
[place] [address] [place] Phone: [phone] Subjective:   I Randall Estes am serving as a Education administrator for Dr. Hulan Saas.  This visit occurred during the SARS-CoV-2 public health emergency.  Safety protocols were in place, including screening questions prior to the visit, additional usage of staff PPE, and extensive cleaning of exam room while observing appropriate contact time as indicated for disinfecting solutions.   I'm seeing this patient by the request  of:  Vivi Barrack, MD  CC: shoulder pain follow up   [id number]   10/07/2019 Nonspecific.  Patient does have some arthritic changes right shoulder greater than left.  Seems to be more secondary to may be some mild underlying arthritis.  Patient given home exercises and hopefully will be beneficial.  Likely will need ultrasound again at follow-up  Repeat injection given.  Tolerated the procedure well.  Increase activity slowly.  Discussed icing regimen and home exercises.  Patient will continue to work on core strengthening.  Discussed certain medications such as gabapentin which patient has had some difficulty previously.  Follow-up again in 4 to 8 weeks  Update 05/16/2020 Randall Estes is a 65 y.o. male coming in with complaint of right shoulder pain. Patient states he would like injection.   Patient was found to have some arthritic changes of the glenohumeral joint as well as the acromioclavicular joint.  Patient is trying to workout on a regular basis but finds it difficult secondary to the amount of pain.    Past Medical History:  Diagnosis Date  . Basal cell carcinoma 02/10/2019   sup-left post shoulder (CX35FU)  . Coronary artery disease   . Hyperlipidemia   . Hypertension   . Old MI (myocardial infarction) 2009   Past Surgical History:  Procedure Laterality Date  . CORONARY ANGIOPLASTY    . DENTAL SURGERY    . NASAL SINUS SURGERY    . VARICOSE VEIN SURGERY      Social History   Socioeconomic History  . Marital status: Married    Spouse name: Not on file  . Number of children: Not on file  . Years of education: Not on file  . Highest education level: Not on file  Occupational History  . Not on file  Tobacco Use  . Smoking status: Former Research scientist (life sciences)  . Smokeless tobacco: Never Used  . Tobacco comment: 2009  Substance and Sexual Activity  . Alcohol use: Yes    Comment: 14  . Drug use: Never  . Sexual activity: Yes    Partners: Female  Other Topics Concern  . Not on file  Social History Narrative  . Not on file   Social Determinants of Health   Financial Resource Strain: Not on file  Food Insecurity: Not on file  Transportation Needs: Not on file  Physical Activity: Not on file  Stress: Not on file  Social Connections: Not on file   No Known Allergies Family History  Problem Relation Age of Onset  . Prostate cancer Father   . Cancer Paternal Grandmother   . Heart disease Neg Hx      Current Outpatient Medications (Cardiovascular):  .  atorvastatin (LIPITOR) 40 MG tablet, Take 40 mg by mouth daily. .  carvedilol (COREG) 6.25 MG tablet, Take 1 tablet (6.25 mg total) by mouth 2 (two) times daily. Marland Kitchen  ezetimibe (ZETIA) 10 MG tablet, Take 1 tablet (10 mg total) by mouth daily. Marland Kitchen  losartan (COZAAR) 50 MG tablet, Take  1 tablet (50 mg total) by mouth daily.   Current Outpatient Medications (Analgesics):  .  aspirin 81 MG tablet, Take 1 tablet (81 mg total) by mouth daily.   Current Outpatient Medications (Other):  .  cholecalciferol (VITAMIN D3) 25 MCG (1000 UNIT) tablet, Take 1,000 Units by mouth daily. .  citalopram (CELEXA) 20 MG tablet, TAKE 1 TABLET BY MOUTH EVERY DAY .  Coenzyme Q10 (COQ10) 100 MG CAPS, Take 200 mg by mouth daily. Stasia Cavalier (EUCRISA) 2 % OINT, Apply 1 application topically daily. Marland Kitchen  LORazepam (ATIVAN) 1 MG tablet, Take 0.5 tablets (0.5 mg total) by mouth 2 (two) times daily as needed for anxiety. .   Multiple Vitamin (ONE DAILY) tablet, Take by mouth. .  thiamine (VITAMIN B-1) 50 MG tablet, Take 50 mg by mouth daily. Marland Kitchen  zinc gluconate 50 MG tablet, Take by mouth.   Reviewed prior external information including notes and imaging from  primary care provider As well as notes that were available from care everywhere and other healthcare systems.  Past medical history, social, surgical and family history all reviewed in electronic medical record.  No pertanent information unless stated regarding to the chief complaint.   Review of Systems:  No headache, visual changes, nausea, vomiting, diarrhea, constipation, dizziness, abdominal pain, skin rash, fevers, chills, night sweats, weight loss, swollen lymph nodes, body aches, joint swelling, chest pain, shortness of breath, mood changes. POSITIVE muscle aches  Objective  Blood pressure (!) 158/80, pulse 66, height 6\' 1"  (1.854 m), weight 257 lb (116.6 kg), SpO2 98 %.   General: No apparent distress alert and oriented x3 mood and affect normal, dressed appropriately.  HEENT: Pupils equal, extraocular movements intact  Respiratory: Patient's speak in full sentences and does not appear short of breath  Cardiovascular: No lower extremity edema, non tender, no erythema  Gait mildly antalgic MSK: Right shoulder exam shows the patient does have positive Hawkins and Neer's.  Patient's rotator cuff though does appear intact.  Positive crossover.  Patient does have mild swelling noted of the acromioclavicular joint.   Procedure: Real-time Ultrasound Guided Injection of right glenohumeral joint Device: GE Logiq Q7  Ultrasound guided injection is preferred based studies that show increased duration, increased effect, greater accuracy, decreased procedural pain, increased response rate with ultrasound guided versus blind injection.  Verbal informed consent obtained.  Time-out conducted.  Noted no overlying erythema, induration, or other signs of local  infection.  Skin prepped in a sterile fashion.  Local anesthesia: Topical Ethyl chloride.  With sterile technique and under real time ultrasound guidance:  Joint visualized.  23g 1  inch needle inserted posterior approach. Pictures taken for needle placement. Patient did have injection of 2 cc of 1% lidocaine, 2 cc of 0.5% Marcaine, and 1.0 cc of Kenalog 40 mg/dL. Completed without difficulty  Pain immediately improved suggesting accurate placement of the medication.  Advised to call if fevers/chills, erythema, induration, drainage, or persistent bleeding.  Impression: Technically successful ultrasound guided injection.  Procedure: Real-time Ultrasound Guided Injection of right acromioclavicular joint Device: GE Logiq Q7 Ultrasound guided injection is preferred based studies that show increased duration, increased effect, greater accuracy, decreased procedural pain, increased response rate, and decreased cost with ultrasound guided versus blind injection.  Verbal informed consent obtained.  Time-out conducted.  Noted no overlying erythema, induration, or other signs of local infection.  Skin prepped in a sterile fashion.  Local anesthesia: Topical Ethyl chloride.  With sterile technique and under real time ultrasound  guidance: With a 25-gauge half inch needle injected with 0.5 cc of 0.5% Marcaine and 0.5 cc of Kenalog 40 mg/mL Completed without difficulty  Pain immediately improved suggesting accurate placement of the medication.  Advised to call if fevers/chills, erythema, induration, drainage, or persistent bleeding.  Impression: Technically successful ultrasound guided injection.   Impression and Recommendations:     The above documentation has been reviewed and is accurate and complete [name], DO

## 2020-05-16 ENCOUNTER — Ambulatory Visit: Payer: 59 | Admitting: Family Medicine

## 2020-05-16 ENCOUNTER — Encounter: Payer: Self-pay | Admitting: Family Medicine

## 2020-05-16 ENCOUNTER — Other Ambulatory Visit: Payer: Self-pay

## 2020-05-16 ENCOUNTER — Ambulatory Visit: Payer: Self-pay

## 2020-05-16 VITALS — BP 158/80 | HR 66 | Ht 73.0 in | Wt 257.0 lb

## 2020-05-16 DIAGNOSIS — M19011 Primary osteoarthritis, right shoulder: Secondary | ICD-10-CM | POA: Diagnosis not present

## 2020-05-16 DIAGNOSIS — G8929 Other chronic pain: Secondary | ICD-10-CM | POA: Diagnosis not present

## 2020-05-16 DIAGNOSIS — M25511 Pain in right shoulder: Secondary | ICD-10-CM | POA: Diagnosis not present

## 2020-05-16 NOTE — Assessment & Plan Note (Signed)
Injection given.  Tolerated procedure well, discussed icing regimen and home exercise, discussed avoiding certain activities.  Hopefully patient will respond well.  Worsening pain may need to consider advanced imaging with patient having 2 injections over the course of time.

## 2020-05-16 NOTE — Assessment & Plan Note (Signed)
Given injection today in the glenohumeral joint.  Patient has x-rays showing inferior glenohumeral arthritis.  May need to consider the possibility of advanced imaging.  Patient has had 2 of these injections in 8 months.  We will see how long this lasts.  Discussed home exercises and icing regimen, which activities to do which wants to avoid.  Follow-up again in 6 to 8 weeks

## 2020-05-16 NOTE — Patient Instructions (Addendum)
Good to see you Injected shoulder today If not great in 4 weeks let us know we can get MRI Keep hands within peripheral vision See me again in 6 weeks

## 2020-05-24 ENCOUNTER — Ambulatory Visit: Payer: 59 | Admitting: Dermatology

## 2020-06-03 ENCOUNTER — Encounter: Payer: Self-pay | Admitting: Family Medicine

## 2020-06-13 ENCOUNTER — Ambulatory Visit: Payer: 59 | Admitting: Dermatology

## 2020-06-30 ENCOUNTER — Ambulatory Visit: Payer: 59 | Admitting: Family Medicine

## 2020-07-26 ENCOUNTER — Telehealth: Payer: Self-pay | Admitting: Cardiovascular Disease

## 2020-07-26 DIAGNOSIS — I10 Essential (primary) hypertension: Secondary | ICD-10-CM

## 2020-07-26 MED ORDER — CARVEDILOL 6.25 MG PO TABS: 6.2500 mg | ORAL_TABLET | Freq: Two times a day (BID) | ORAL | 1 refills | Status: DC

## 2020-07-26 MED ORDER — LOSARTAN POTASSIUM 50 MG PO TABS: 50.0000 mg | ORAL_TABLET | Freq: Every day | ORAL | 1 refills | Status: DC

## 2020-07-26 NOTE — Telephone Encounter (Signed)
*  STAT* If patient is at the pharmacy, call can be transferred to refill team.   1. Which medications need to be refilled? (please list name of each medication and dose if known)  losartan (COZAAR) 50 MG tablet  carvedilol (COREG) 6.25 MG tablet   2. Which pharmacy/location (including street and city if local pharmacy) is medication to be sent to? [facility] [place], [place] - [address]  3. Do they need a 30 day or 90 day supply? 90 day supply

## 2020-08-04 NOTE — Progress Notes (Signed)
[facility] Phone: [phone] Subjective:   [name], am serving as a scribe for Dr. Hulan Saas. This visit occurred during the SARS-CoV-2 public health emergency.  Safety protocols were in place, including screening questions prior to the visit, additional usage of staff PPE, and extensive cleaning of exam room while observing appropriate contact time as indicated for disinfecting solutions.   I'm seeing this patient by the request  of:  Vivi Barrack, MD  CC: Right heel pain  [id number]  Randall Estes is a 65 y.o. male coming in with complaint of R heel pain. Last seen in April for R shoulder pain.  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  Has had 2 injections in the past 10 months.  Patient states that he has had pain for 2 months. Pain is constant. Resting foot at night increases his pain. Pain over medial calcaneous. Takes Aleve in mornings for pain.       Past Medical History:  Diagnosis Date   Basal cell carcinoma 02/10/2019   sup-left post shoulder (CX35FU)   Coronary artery disease    Hyperlipidemia    Hypertension    Old MI (myocardial infarction) 2009   Past Surgical History:  Procedure Laterality Date   CORONARY ANGIOPLASTY     DENTAL SURGERY     NASAL SINUS SURGERY     VARICOSE VEIN SURGERY     Social History   Socioeconomic History   Marital status: Married    Spouse name: Not on file   Number of children: Not on file   Years of education: Not on file   Highest education level: Not on file  Occupational History   Not on file  Tobacco Use   Smoking status: Former    Pack years: 0.00   Smokeless tobacco: Never   Tobacco comments:    2009  Substance and Sexual Activity   Alcohol use: Yes    Comment: 14   Drug use: Never   Sexual activity: Yes    Partners: Female  Other Topics Concern   Not on file  Social  History Narrative   Not on file   Social Determinants of Health   Financial Resource Strain: Not on file  Food Insecurity: Not on file  Transportation Needs: Not on file  Physical Activity: Not on file  Stress: Not on file  Social Connections: Not on file   No Known Allergies Family History  Problem Relation Age of Onset   Prostate cancer Father    Cancer Paternal Grandmother    Heart disease Neg Hx      Current Outpatient Medications (Cardiovascular):    atorvastatin (LIPITOR) 40 MG tablet, Take 40 mg by mouth daily.   carvedilol (COREG) 6.25 MG tablet, Take 1 tablet (6.25 mg total) by mouth 2 (two) times daily.   losartan (COZAAR) 50 MG tablet, Take 1 tablet (50 mg total) by mouth daily.   ezetimibe (ZETIA) 10 MG tablet, Take 1 tablet (10 mg total) by mouth daily.   Current Outpatient Medications (Analgesics):    aspirin 81 MG tablet, Take 1 tablet (81 mg total) by mouth daily.   Current Outpatient Medications (Other):    cholecalciferol (VITAMIN D3) 25 MCG (1000 UNIT) tablet, Take 1,000 Units by mouth daily.   citalopram (CELEXA) 20 MG tablet, TAKE 1 TABLET BY MOUTH EVERY DAY   Coenzyme Q10 (COQ10) 100  MG CAPS, Take 200 mg by mouth daily.   Crisaborole (EUCRISA) 2 % OINT, Apply 1 application topically daily.   LORazepam (ATIVAN) 1 MG tablet, Take 0.5 tablets (0.5 mg total) by mouth 2 (two) times daily as needed for anxiety.   Multiple Vitamin (ONE DAILY) tablet, Take by mouth.   thiamine (VITAMIN B-1) 50 MG tablet, Take 50 mg by mouth daily.   zinc gluconate 50 MG tablet, Take by mouth.   Reviewed prior external information including notes and imaging from  primary care provider As well as notes that were available from care everywhere and other healthcare systems.  Past medical history, social, surgical and family history all reviewed in electronic medical record.  No pertanent information unless stated regarding to the chief complaint.   Review of Systems:  No  headache, visual changes, nausea, vomiting, diarrhea, constipation, dizziness, abdominal pain, skin rash, fevers, chills, night sweats, weight loss, swollen lymph nodes,  joint swelling, chest pain, shortness of breath, mood changes. POSITIVE muscle aches, body aches  Objective  Blood pressure (!) 144/102, pulse 62, height 6\' 1"  (1.854 m), weight 259 lb (117.5 kg), SpO2 97 %.   General: No apparent distress alert and oriented x3 mood and affect normal, dressed appropriately.  HEENT: Pupils equal, extraocular movements intact  Respiratory: Patient's speak in full sentences and does not appear short of breath  Cardiovascular: No lower extremity edema, non tender, no erythema  Gait normal with good balance and coordination.  MSK:   Ankle exam on the right side shows patient on the right side does have tenderness to palpation at the insertion of the Achilles more on the medial aspect.  No significant swelling noted.  Very small Haglund nodule noted.  Patient has very minimal pain on the plantar aspect of the foot.  Patient does have mild tightness of the posterior cord.  Neurovascularly intact distally.  5-5 strength in lower extremity.  Shoulder exam shows patient is able to move the shoulder in all good range of motion's today.  Limited musculoskeletal ultrasound was performed and interpreted by [name]  Limited musculoskeletal ultrasound of patient's right ankle shows the patient does have some calcific changes noted at the insertion of the Achilles on the medial aspect.  Cannot tell cortical irregularity.  Calcaneal itself or the possibility of calcific tendinitis of no significant hypoechoic changes the patient does have some increasing in Doppler flow in the area. Impression: Mild calcific changes of the insertional medial band of the Achilles.   Impression and Recommendations:     The above documentation has been reviewed and is accurate and complete [name], DO

## 2020-08-05 ENCOUNTER — Encounter: Payer: Self-pay | Admitting: Family Medicine

## 2020-08-05 ENCOUNTER — Other Ambulatory Visit: Payer: Self-pay

## 2020-08-05 ENCOUNTER — Ambulatory Visit: Payer: Self-pay

## 2020-08-05 ENCOUNTER — Ambulatory Visit (INDEPENDENT_AMBULATORY_CARE_PROVIDER_SITE_OTHER): Payer: 59

## 2020-08-05 ENCOUNTER — Ambulatory Visit: Payer: 59 | Admitting: Family Medicine

## 2020-08-05 VITALS — BP 144/102 | HR 62 | Ht 73.0 in | Wt 259.0 lb

## 2020-08-05 DIAGNOSIS — M25511 Pain in right shoulder: Secondary | ICD-10-CM | POA: Diagnosis not present

## 2020-08-05 DIAGNOSIS — M79671 Pain in right foot: Secondary | ICD-10-CM

## 2020-08-05 DIAGNOSIS — G8929 Other chronic pain: Secondary | ICD-10-CM | POA: Diagnosis not present

## 2020-08-05 NOTE — Assessment & Plan Note (Signed)
Patient is right heel pain.  Does seem to have some mild swelling noted of the posterior calcaneal region.  No significant tenderness of the Achilles noted the patient is tender in the insertional area.  We discussed antiemetic he will, home exercises, over-the-counter orthotics, as well as recovery sandals in the house.  Patient will follow up with me again in 4 to 6 weeks

## 2020-08-05 NOTE — Assessment & Plan Note (Signed)
Doing very well at this time.  No significant trouble.  Good range of motion.  Continue conservative therapy

## 2020-08-05 NOTE — Patient Instructions (Addendum)
[employer] Total Support Orthotics OOFOS or HOKA recovery sandals in the house Exercises 3x a week Pneumatic ankle brace Xray today See me in 6 weeks

## 2020-08-08 ENCOUNTER — Encounter: Payer: Self-pay | Admitting: Family Medicine

## 2020-08-10 NOTE — Telephone Encounter (Signed)
Pt returned brace to office.

## 2020-09-07 ENCOUNTER — Ambulatory Visit: Payer: 59 | Admitting: Family Medicine

## 2020-09-16 ENCOUNTER — Ambulatory Visit: Payer: 59 | Admitting: Family Medicine

## 2020-10-09 NOTE — Progress Notes (Signed)
Cardiology Clinic Note   Patient Name: Randall Estes Date of Encounter: 10/10/2020  Primary Care Provider:  Vivi Barrack, MD Primary Cardiologist:  Randall Burow, MD  Patient Profile    Randall Estes 65 year old male presents the clinic today for follow-up evaluation of his palpitations and HLD.  Past Medical History    Past Medical History:  Diagnosis Date   Basal cell carcinoma 02/10/2019   sup-left post shoulder (CX35FU)   Coronary artery disease    Hyperlipidemia    Hypertension    Old MI (myocardial infarction) 2009   Past Surgical History:  Procedure Laterality Date   CORONARY ANGIOPLASTY     DENTAL SURGERY     NASAL SINUS SURGERY     VARICOSE VEIN SURGERY      Allergies  No Known Allergies  History of Present Illness    Randall Estes is a PMH of coronary artery disease (STEMI 08/16/2007 in [place] with stenting x2 to his LAD).  Preserved LV function, physically active working out the gym 3 times per week and doing cardiovascular workouts for 45 minutes bouts without symptoms.  Has PMH also includes hypertension, hyperlipidemia, OSA.   He was started on losartan by his PCP.  It was noted that his losartan caused palpitations.  He was also consuming 3-4 cups of coffee in the morning.  His losartan was cut in half and his palpitations improved.   He presented to the clinic 02/16/2020 for follow-up evaluation stated he felt well.  He continued to be very physically active working out doing both aerobic and anaerobic activities multiple days per week.  He had not noticed any further episodes of palpitations and stated his diet had been poor over the holidays.  He was  working on eating a more heart healthy diet.  He had been monitoring his blood pressure at home and reported systolic blood pressures in the 130s-120s over 70s.  He reported myalgias on increased dose of atorvastatin and was taking 4 mg.  I recommended he see clinical pharmacist for lipid  evaluation.  He wished to defer at the time and work on improving his diet.  I  gave him the salty 6 diet sheet, reviewed increasing fiber in his diet, asked  him to maintain a blood pressure log and follow-up in 6 months.  He presents to the clinic today for follow-up evaluation and states he feels well.  He is exercising in the gym 2 times per week and doing cardio for 30 minutes 2 times per week.  He reports that he is taking his losartan 50 mg in the a.m. and 50 mg p.m. and not having any palpitations.  His blood pressure is well controlled at home 122/70 range.  He has not had any further episodes of palpitations.  I have asked him to continue to monitor his diet.  We will give him salty 6 diet sheet, have him increase his physical activity to 150 minutes of moderate physical activity per week.  I will order a BMP today and have him follow-up in 12 months.   Today he denies chest pain, shortness of breath, lower extremity edema, fatigue, palpitations, melena, hematuria, hemoptysis, diaphoresis, weakness, presyncope, syncope, orthopnea, and PND.   Home Medications    Prior to Admission medications   Medication Sig Start Date End Date Taking? Authorizing Provider  aspirin 81 MG tablet Take 1 tablet (81 mg total) by mouth daily. 07/29/15   Lorretta Harp, MD  atorvastatin (LIPITOR) 40 MG tablet  Take 40 mg by mouth daily.    [provider]  carvedilol (COREG) 6.25 MG tablet Take 1 tablet (6.25 mg total) by mouth 2 (two) times daily. 07/26/20   Lorretta Harp, MD  cholecalciferol (VITAMIN D3) 25 MCG (1000 UNIT) tablet Take 1,000 Units by mouth daily.    [provider]  citalopram (CELEXA) 20 MG tablet TAKE 1 TABLET BY MOUTH EVERY DAY 04/05/20   Randall Barrack, MD  Coenzyme Q10 (COQ10) 100 MG CAPS Take 200 mg by mouth daily. 10/31/18   Lorretta Harp, MD  Crisaborole (EUCRISA) 2 % OINT Apply 1 application topically daily. 09/16/19   Lavonna Monarch, MD  ezetimibe (ZETIA) 10 MG  tablet Take 1 tablet (10 mg total) by mouth daily. 09/04/19 12/03/19  Lorretta Harp, MD  LORazepam (ATIVAN) 1 MG tablet Take 0.5 tablets (0.5 mg total) by mouth 2 (two) times daily as needed for anxiety. 03/14/20   Randall Barrack, MD  losartan (COZAAR) 50 MG tablet Take 1 tablet (50 mg total) by mouth daily. 07/26/20 10/24/20  Lorretta Harp, MD  Multiple Vitamin (ONE DAILY) tablet Take by mouth.    [provider]  thiamine (VITAMIN B-1) 50 MG tablet Take 50 mg by mouth daily.    [provider]  zinc gluconate 50 MG tablet Take by mouth.    [provider]    Family History    Family History  Problem Relation Age of Onset   Prostate cancer Father    Cancer Paternal Grandmother    Heart disease Neg Hx    He indicated that his mother is alive. He indicated that his father is alive. He indicated that the status of his paternal grandmother is unknown. He indicated that the status of his neg hx is unknown.  Social History    Social History   Socioeconomic History   Marital status: Married    Spouse name: Not on file   Number of children: Not on file   Years of education: Not on file   Highest education level: Not on file  Occupational History   Not on file  Tobacco Use   Smoking status: Former   Smokeless tobacco: Never   Tobacco comments:    2009  Substance and Sexual Activity   Alcohol use: Yes    Comment: 14   Drug use: Never   Sexual activity: Yes    Partners: Female  Other Topics Concern   Not on file  Social History Narrative   Not on file   Social Determinants of Health   Financial Resource Strain: Not on file  Food Insecurity: Not on file  Transportation Needs: Not on file  Physical Activity: Not on file  Stress: Not on file  Social Connections: Not on file  Intimate Partner Violence: Not on file     Review of Systems    General:  No chills, fever, night sweats or weight changes.  Cardiovascular:  No chest pain, dyspnea  on exertion, edema, orthopnea, palpitations, paroxysmal nocturnal dyspnea. Dermatological: No rash, lesions/masses Respiratory: No cough, dyspnea Urologic: No hematuria, dysuria Abdominal:   No nausea, vomiting, diarrhea, bright red blood per rectum, melena, or hematemesis Neurologic:  No visual changes, wkns, changes in mental status. All other systems reviewed and are otherwise negative except as noted above.  Physical Exam    VS:  BP (!) 142/60 (BP Location: Left Arm, Patient Position: Sitting, Cuff Size: Large)   Pulse 62   Ht  $'6\' 1"'r$  (1.854 m)   Wt 269 lb 3.2 oz (122.1 kg)   SpO2 98%   BMI 35.52 kg/m  , BMI Body mass index is 35.52 kg/m. GEN: Well nourished, well developed, in no acute distress. HEENT: normal. Neck: Supple, no JVD, carotid bruits, or masses. Cardiac: RRR, no murmurs, rubs, or gallops. No clubbing, cyanosis, edema.  Radials/DP/PT 2+ and equal bilaterally.  Respiratory:  Respirations regular and unlabored, clear to auscultation bilaterally. GI: Soft, nontender, nondistended, BS + x 4. MS: no deformity or atrophy. Skin: warm and dry, no rash. Neuro:  Strength and sensation are intact. Psych: Normal affect.  Accessory Clinical Findings    Recent Labs: 02/10/2020: ALT 26; BUN 21; Creatinine, Ser 1.05; Hemoglobin 14.2; Platelets 241.0; Potassium 4.3; Sodium 138; TSH 2.65   Recent Lipid Panel    Component Value Date/Time   CHOL 181 02/10/2020 0957   CHOL 157 08/26/2019 0829   TRIG 192.0 (H) 02/10/2020 0957   HDL 59.20 02/10/2020 0957   HDL 49 08/26/2019 0829   CHOLHDL 3 02/10/2020 0957   VLDL 38.4 02/10/2020 0957   LDLCALC 84 02/10/2020 0957   LDLCALC 86 08/26/2019 0829    ECG personally reviewed by me today-none today.    Cardiac event monitor 08/10/2019 1. SR/SB 2. Occassional PVCs 3. Short runs of NSVT  Assessment & Plan   1.   Palpitations-resolved.   [place] clinic has started him on losartan which was contributing to palpitations.   Dosing was decreased and palpitations dissipated.   Continue to maintain p.o. hydration Losartan 50 mg BID Continue Carvedilol Avoid triggers caffeine, chocolate, EtOH, dehydration etc. Maintain physical activity Order BMP  Hyperlipidemia-02/10/2020: Cholesterol 181; HDL 59.20; LDL Cholesterol 84; Triglycerides 192.0; VLDL 38.4 he was not able to tolerate 80 mg daily of atorvastatin and has reduced back to 40 mg daily.  Previously offered to follow-up with lipid clinic.  Patient wished to defer.  Continues to work on heart healthy low-sodium high-fiber diet and increasing his physical activity. Continue atorvastatin Heart healthy low-sodium high-fiber diet. Increase physical activity as tolerated Repeat fasting lipids and LFTs 12/22  Obstructive sleep apnea-  Underwent uvulopalatopharyngoplasty more than 20 years ago.  Reports restful sleep.  Apnea resolved.     Disposition: Follow-up with Dr. Gwenlyn Found in 9-12 months.   Jossie Ng. Chrishon Martino NP-C    10/10/2020, 10:07 AM [address] Office [phone] Fax [phone]  Notice: This dictation was prepared with Dragon dictation along with smaller phrase technology. Any transcriptional errors that result from this process are unintentional and may not be corrected upon review.  I spent 13 minutes examining this patient, reviewing medications, and using patient centered shared decision making involving her cardiac care.  Prior to her visit I spent greater than 20 minutes reviewing her past medical history,  medications, and prior cardiac tests.

## 2020-10-10 ENCOUNTER — Ambulatory Visit: Payer: 59 | Admitting: General Practice

## 2020-10-10 ENCOUNTER — Encounter: Payer: Self-pay | Admitting: General Practice

## 2020-10-10 ENCOUNTER — Other Ambulatory Visit: Payer: Self-pay

## 2020-10-10 VITALS — BP 142/60 | HR 62 | Ht 73.0 in | Wt 269.2 lb

## 2020-10-10 DIAGNOSIS — R002 Palpitations: Secondary | ICD-10-CM

## 2020-10-10 DIAGNOSIS — G4733 Obstructive sleep apnea (adult) (pediatric): Secondary | ICD-10-CM | POA: Diagnosis not present

## 2020-10-10 DIAGNOSIS — Z79899 Other long term (current) drug therapy: Secondary | ICD-10-CM | POA: Diagnosis not present

## 2020-10-10 DIAGNOSIS — E782 Mixed hyperlipidemia: Secondary | ICD-10-CM

## 2020-10-10 LAB — BASIC METABOLIC PANEL
BUN/Creatinine Ratio: 19 (ref 10–24)
BUN: 25 mg/dL (ref 8–27)
CO2: 24 mmol/L (ref 20–29)
Calcium: 9.9 mg/dL (ref 8.6–10.2)
Chloride: 99 mmol/L (ref 96–106)
Creatinine, Ser: 1.29 mg/dL — ABNORMAL HIGH (ref 0.76–1.27)
Glucose: 95 mg/dL (ref 65–99)
Potassium: 5.1 mmol/L (ref 3.5–5.2)
Sodium: 137 mmol/L (ref 134–144)
eGFR: 62 mL/min/{1.73_m2} (ref >59)

## 2020-10-10 MED ORDER — LOSARTAN POTASSIUM 50 MG PO TABS: 50.0000 mg | ORAL_TABLET | Freq: Two times a day (BID) | ORAL | 1 refills | Status: DC

## 2020-10-10 NOTE — Patient Instructions (Signed)
Medication Instructions:  INCREASE LOSARTAN '50MG'$  TWICE DAILY  *If you need a refill on your cardiac medications before your next appointment, please call your pharmacy*  Lab Work:    BMET TODAY      Special Instructions PLEASE READ AND FOLLOW SALTY 6-ATTACHED-1,'800mg'$  daily  PLEASE INCREASE PHYSICAL ACTIVITY AS TOLERATED, GOAL-150 MINUTES/WEEKLY OF MODERATE PHYSICAL ACTIVITY  Follow-Up: Your next appointment:  1 year(s) In Person with Quay Burow, MD OR IF UNAVAILABLE JESSE CLEAVER, FNP-C   Please call our office 2 months in advance to schedule this appointment   At [facility], you and your health needs are our priority.  As part of our continuing mission to provide you with exceptional heart care, we have created designated Provider Care Teams.  These Care Teams include your primary Cardiologist (physician) and Advanced Practice Providers (APPs -  Physician Assistants and Nurse Practitioners) who all work together to provide you with the care you need, when you need it.            6 SALTY THINGS TO AVOID     1,'800MG'$  DAILY

## 2020-10-20 ENCOUNTER — Other Ambulatory Visit: Payer: Self-pay

## 2020-10-20 DIAGNOSIS — E782 Mixed hyperlipidemia: Secondary | ICD-10-CM

## 2020-10-20 DIAGNOSIS — Z79899 Other long term (current) drug therapy: Secondary | ICD-10-CM

## 2020-10-20 DIAGNOSIS — I10 Essential (primary) hypertension: Secondary | ICD-10-CM

## 2020-10-20 DIAGNOSIS — I25810 Atherosclerosis of coronary artery bypass graft(s) without angina pectoris: Secondary | ICD-10-CM

## 2020-11-16 ENCOUNTER — Ambulatory Visit: Payer: 59 | Admitting: Cardiovascular Disease

## 2020-11-21 NOTE — Progress Notes (Signed)
Randall Estes Randall Estes [place] Est[place] Phone: [phone] Subjective:   Randall Estes, am serving as a scribe for Dr. Hulan Estes. This visit occurred during the SARS-CoV-2 public health emergency.  Safety protocols were in place, including screening questions prior to the visit, additional usage of staff PPE, and extensive cleaning of exam room while observing appropriate contact time as indicated for disinfecting solutions.   I'm seeing this patient by the request  of:  Randall Barrack, MD  CC: Left knee pain  [id number]  Seen June 2022 for heel pain.  Updated 11/22/2020 Randall Estes is a 65 y.o. male coming in with complaint of left knee pain. He has injured his knee multiple times over his life and an injection has always made it feel better. Sharp pain with certain movements, achy when in a seated position too long. Pain is on medial side       Past Medical History:  Diagnosis Date   Basal cell carcinoma 02/10/2019   sup-left post shoulder (CX35FU)   Coronary artery disease    Hyperlipidemia    Hypertension    Old MI (myocardial infarction) 2009   Past Surgical History:  Procedure Laterality Date   CORONARY ANGIOPLASTY     DENTAL SURGERY     NASAL SINUS SURGERY     VARICOSE VEIN SURGERY     Social History   Socioeconomic History   Marital status: Married    Spouse name: Not on file   Number of children: Not on file   Years of education: Not on file   Highest education level: Not on file  Occupational History   Not on file  Tobacco Use   Smoking status: Former   Smokeless tobacco: Never   Tobacco comments:    2009  Substance and Sexual Activity   Alcohol use: Yes    Comment: 14   Drug use: Never   Sexual activity: Yes    Partners: Female  Other Topics Concern   Not on file  Social History Narrative   Not on file   Social Determinants of Health   Financial Resource Strain: Not on file  Food  Insecurity: Not on file  Transportation Needs: Not on file  Physical Activity: Not on file  Stress: Not on file  Social Connections: Not on file   No Known Allergies Family History  Problem Relation Age of Onset   Prostate cancer Father    Cancer Paternal Grandmother    Heart disease Neg Hx      Current Outpatient Medications (Cardiovascular):    atorvastatin (LIPITOR) 40 MG tablet, Take 40 mg by mouth daily.   carvedilol (COREG) 6.25 MG tablet, Take 1 tablet (6.25 mg total) by mouth 2 (two) times daily.   ezetimibe (ZETIA) 10 MG tablet, Take 1 tablet (10 mg total) by mouth daily.   losartan (COZAAR) 50 MG tablet, Take 1 tablet (50 mg total) by mouth in the morning and at bedtime.   Current Outpatient Medications (Analgesics):    aspirin 81 MG tablet, Take 1 tablet (81 mg total) by mouth daily.   Current Outpatient Medications (Other):    cholecalciferol (VITAMIN D3) 25 MCG (1000 UNIT) tablet, Take 1,000 Units by mouth daily.   Coenzyme Q10 (COQ10) 100 MG CAPS, Take 200 mg by mouth daily.   Crisaborole (EUCRISA) 2 % OINT, Apply 1 application topically daily.   Multiple Vitamin (ONE DAILY) tablet, Take by mouth.   thiamine (VITAMIN B-1)  50 MG tablet, Take 50 mg by mouth daily.   zinc gluconate 50 MG tablet, Take by mouth.   Reviewed prior external information including notes and imaging from  primary care provider As well as notes that were available from care everywhere and other healthcare systems.  Past medical history, social, surgical and family history all reviewed in electronic medical record.  No pertanent information unless stated regarding to the chief complaint.   Review of Systems:  No headache, visual changes, nausea, vomiting, diarrhea, constipation, dizziness, abdominal pain, skin rash, fevers, chills, night sweats, weight loss, swollen lymph nodes, body aches, joint swelling, chest pain, shortness of breath, mood changes. POSITIVE muscle aches  Objective   Blood pressure (!) 158/90, pulse 60, height 6\' 1"  (1.854 m), weight 268 lb (121.6 kg), SpO2 97 %.   General: No apparent distress alert and oriented x3 mood and affect normal, dressed appropriately.  HEENT: Pupils equal, extraocular movements intact  Respiratory: Patient's speak in full sentences and does not appear short of breath  Cardiovascular: No lower extremity edema, non tender, no erythema  Gait normal with good balance and coordination.  MSK: Left knee exam does have a trace effusion noted.  Tender to palpation over the medial joint space.  Positive McMurray's noted.   Limited muscular skeletal ultrasound was performed and interpreted by Randall Estes, M  Limited musculoskeletal ultrasound was performed and shows the patient does have a posterior medial meniscus tear that does have some displacement noted.  Trace effusion noted of the patellofemoral joint with mild narrowing as well. Impression: Degenerative meniscus tear with mild displacement and hypoechoic changes  After informed written and verbal consent, patient was seated on exam table. Left knee was prepped with alcohol swab and utilizing anterolateral approach, patient's left knee space was injected with 4:1  marcaine 0.5%: Kenalog 40mg /dL. Patient tolerated the procedure well without immediate complications.   Impression and Recommendations:     The above documentation has been reviewed and is accurate and complete Randall Pulley, DO

## 2020-11-22 ENCOUNTER — Ambulatory Visit (INDEPENDENT_AMBULATORY_CARE_PROVIDER_SITE_OTHER): Payer: 59

## 2020-11-22 ENCOUNTER — Encounter: Payer: Self-pay | Admitting: Family Medicine

## 2020-11-22 ENCOUNTER — Other Ambulatory Visit: Payer: Self-pay

## 2020-11-22 ENCOUNTER — Ambulatory Visit: Payer: Self-pay

## 2020-11-22 ENCOUNTER — Ambulatory Visit: Payer: 59 | Admitting: Family Medicine

## 2020-11-22 VITALS — BP 158/90 | HR 60 | Ht 73.0 in | Wt 268.0 lb

## 2020-11-22 DIAGNOSIS — M23204 Derangement of unspecified medial meniscus due to old tear or injury, left knee: Secondary | ICD-10-CM | POA: Diagnosis not present

## 2020-11-22 DIAGNOSIS — M25562 Pain in left knee: Secondary | ICD-10-CM | POA: Diagnosis not present

## 2020-11-22 NOTE — Assessment & Plan Note (Signed)
Patient given injection today and tolerated the procedure well, discussed icing regimen and home exercises.  Discussed which activities to do which wants to avoid.  Increase activity slowly.  Follow-up again in 4 to 6 weeks.  Worsening pain consider formal physical therapy or if any locking may need advanced imaging.  X-rays are pending

## 2020-11-22 NOTE — Patient Instructions (Addendum)
Xray today Injection today Do prescribed exercises at least 3x a week See you again in 6-8 weeks

## 2020-11-25 ENCOUNTER — Other Ambulatory Visit: Payer: Self-pay

## 2020-11-25 DIAGNOSIS — I10 Essential (primary) hypertension: Secondary | ICD-10-CM

## 2020-11-25 DIAGNOSIS — Z79899 Other long term (current) drug therapy: Secondary | ICD-10-CM

## 2020-11-25 DIAGNOSIS — E782 Mixed hyperlipidemia: Secondary | ICD-10-CM

## 2020-11-25 DIAGNOSIS — I25810 Atherosclerosis of coronary artery bypass graft(s) without angina pectoris: Secondary | ICD-10-CM

## 2020-11-26 LAB — BASIC METABOLIC PANEL
BUN/Creatinine Ratio: 24 (ref 10–24)
BUN: 30 mg/dL — ABNORMAL HIGH (ref 8–27)
CO2: 20 mmol/L (ref 20–29)
Calcium: 9.8 mg/dL (ref 8.6–10.2)
Chloride: 98 mmol/L (ref 96–106)
Creatinine, Ser: 1.26 mg/dL (ref 0.76–1.27)
Glucose: 124 mg/dL — ABNORMAL HIGH (ref 70–99)
Potassium: 4.6 mmol/L (ref 3.5–5.2)
Sodium: 138 mmol/L (ref 134–144)
eGFR: 64 mL/min/{1.73_m2} (ref >59)

## 2021-01-09 ENCOUNTER — Other Ambulatory Visit: Payer: Self-pay | Admitting: Dermatology

## 2021-01-10 ENCOUNTER — Ambulatory Visit: Payer: 59 | Admitting: Family Medicine

## 2021-02-05 ENCOUNTER — Other Ambulatory Visit: Payer: Self-pay | Admitting: Cardiovascular Disease

## 2021-02-05 DIAGNOSIS — I10 Essential (primary) hypertension: Secondary | ICD-10-CM

## 2021-02-08 NOTE — Progress Notes (Signed)
[place] Phone: [phone] Subjective:   Randall Estes, am serving as a scribe for Dr. Hulan Saas. This visit occurred during the SARS-CoV-2 public health emergency.  Safety protocols were in place, including screening questions prior to the visit, additional usage of staff PPE, and extensive cleaning of exam room while observing appropriate contact time as indicated for disinfecting solutions.  I'm seeing this patient by the request  of:  Vivi Barrack, MD  CC:   [id number]  11/22/2020 Patient given injection today and tolerated the procedure well, discussed icing regimen and home exercises.  Discussed which activities to do which wants to avoid.  Increase activity slowly.  Follow-up again in 4 to 6 weeks.  Worsening pain consider formal physical therapy or if any locking may need advanced imaging.  X-rays are pending  Update 02/09/2021 Randall Estes is a 65 y.o. male coming in with complaint of R knee pain. Patient states he hit the R knee recently and noticed swelling one week ago. Pain in anterior aspect of knee. Using ace wrap. Not taking any medication for pain.   L knee has been doing well since last visit.   Xray L knee 11/22/2020 Negative     Past Medical History:  Diagnosis Date   Basal cell carcinoma 02/10/2019   sup-left post shoulder (CX35FU)   Coronary artery disease    Hyperlipidemia    Hypertension    Old MI (myocardial infarction) 2009   Past Surgical History:  Procedure Laterality Date   CORONARY ANGIOPLASTY     DENTAL SURGERY     NASAL SINUS SURGERY     VARICOSE VEIN SURGERY     Social History   Socioeconomic History   Marital status: Married    Spouse name: Not on file   Number of children: Not on file   Years of education: Not on file   Highest education level: Not on file  Occupational History   Not on file  Tobacco Use   Smoking status: Former   Smokeless  tobacco: Never   Tobacco comments:    2009  Substance and Sexual Activity   Alcohol use: Yes    Comment: 14   Drug use: Never   Sexual activity: Yes    Partners: Female  Other Topics Concern   Not on file  Social History Narrative   Not on file   Social Determinants of Health   Financial Resource Strain: Not on file  Food Insecurity: Not on file  Transportation Needs: Not on file  Physical Activity: Not on file  Stress: Not on file  Social Connections: Not on file   Estes Known Allergies Family History  Problem Relation Age of Onset   Prostate cancer Father    Cancer Paternal Grandmother    Heart disease Neg Hx      Current Outpatient Medications (Cardiovascular):    atorvastatin (LIPITOR) 40 MG tablet, Take 40 mg by mouth daily.   carvedilol (COREG) 6.25 MG tablet, TAKE 1 TABLET BY MOUTH TWICE A DAY   ezetimibe (ZETIA) 10 MG tablet, Take 1 tablet (10 mg total) by mouth daily.   losartan (COZAAR) 50 MG tablet, Take 1 tablet (50 mg total) by mouth in the morning and at bedtime.   Current Outpatient Medications (Analgesics):    aspirin 81 MG tablet, Take 1 tablet (81 mg total) by mouth daily.   Current Outpatient Medications (Other):    cholecalciferol (VITAMIN D3)  25 MCG (1000 UNIT) tablet, Take 1,000 Units by mouth daily.   Coenzyme Q10 (COQ10) 100 MG CAPS, Take 200 mg by mouth daily.   Crisaborole (EUCRISA) 2 % OINT, Apply 1 application topically daily.   Multiple Vitamin (ONE DAILY) tablet, Take by mouth.   thiamine (VITAMIN B-1) 50 MG tablet, Take 50 mg by mouth daily.   zinc gluconate 50 MG tablet, Take by mouth.   Reviewed prior external information including notes and imaging from  primary care provider As well as notes that were available from care everywhere and other healthcare systems.  Past medical history, social, surgical and family history all reviewed in electronic medical record.  Estes pertanent information unless stated regarding to the chief  complaint.   Review of Systems:  Estes headache, visual changes, nausea, vomiting, diarrhea, constipation, dizziness, abdominal pain, skin rash, fevers, chills, night sweats, weight loss, swollen lymph nodes, body aches, joint swelling, chest pain, shortness of breath, mood changes. POSITIVE muscle aches  Objective  Blood pressure (!) 146/98, pulse 84, height 6\' 1"  (1.854 m), weight 269 lb (122 kg), SpO2 98 %.   General: Estes apparent distress alert and oriented x3 mood and affect normal, dressed appropriately.  HEENT: Pupils equal, extraocular movements intact  Respiratory: Patient's speak in full sentences and does not appear short of breath  Cardiovascular: Estes lower extremity edema, non tender, Estes erythema  Gait normal with good balance and coordination.  MSK: Right knee exam shows the patient does have good range of motion but does have swelling of the prepatellar bursa.  Does have fluctuance noted.  Estes signs of any infectious etiology.  Procedure: Real-time Ultrasound Guided aspiration and injection of prepatellar bursa Device: GE Logiq Q7 Ultrasound guided injection is preferred based studies that show increased duration, increased effect, greater accuracy, decreased procedural pain, increased response rate, and decreased cost with ultrasound guided versus blind injection.  Verbal informed consent obtained.  Time-out conducted.  Noted Estes overlying erythema, induration, or other signs of local infection.  Skin prepped in a sterile fashion.  Local anesthesia: Topical Ethyl chloride.  With sterile technique and under real time ultrasound guidance: With a 18-gauge 1-1/2 inch needle patient was injected with 1 cc of 0.5% Marcaine.  Patient did have aspiration of 25 cc of frank blood.  Then injected 0.5 cc of Kenalog 40 mg/mL. Completed without difficulty  Pain immediately resolved suggesting accurate placement of the medication.  Advised to call if fevers/chills, erythema, induration, drainage,  or persistent bleeding.  Impression: Technically successful ultrasound guided injection.    Impression and Recommendations:     The above documentation has been reviewed and is accurate and complete [name], DO

## 2021-02-09 ENCOUNTER — Ambulatory Visit (INDEPENDENT_AMBULATORY_CARE_PROVIDER_SITE_OTHER): Payer: 59

## 2021-02-09 ENCOUNTER — Ambulatory Visit: Payer: 59 | Admitting: Family Medicine

## 2021-02-09 ENCOUNTER — Ambulatory Visit: Payer: Self-pay

## 2021-02-09 ENCOUNTER — Other Ambulatory Visit: Payer: Self-pay

## 2021-02-09 ENCOUNTER — Encounter: Payer: Self-pay | Admitting: Family Medicine

## 2021-02-09 VITALS — BP 146/98 | HR 84 | Ht 73.0 in | Wt 269.0 lb

## 2021-02-09 DIAGNOSIS — M25561 Pain in right knee: Secondary | ICD-10-CM

## 2021-02-09 DIAGNOSIS — M7051 Other bursitis of knee, right knee: Secondary | ICD-10-CM

## 2021-02-09 DIAGNOSIS — G8929 Other chronic pain: Secondary | ICD-10-CM

## 2021-02-09 DIAGNOSIS — M25562 Pain in left knee: Secondary | ICD-10-CM | POA: Diagnosis not present

## 2021-02-09 NOTE — Patient Instructions (Addendum)
Xray today Compression daily for next 3-5 days No need to wear at night See me again at scheduled visit

## 2021-02-09 NOTE — Assessment & Plan Note (Addendum)
Patient did have hemorrhagic bursitis noted.  Patient did have aspiration done today with improvement in range of motion.  Patient did have the discomfort.  Discussed with patient about icing regimen and home exercises, discussed which activities to do which wants to avoid.  Increase activity total.  Patient was able to ambulate with no significant difficulty.  Patient warned that there is a possibility for reaccumulation.  Discussed compression.  Follow-up again in 3 weeks

## 2021-02-21 NOTE — Progress Notes (Signed)
[place] Phone: [phone] Subjective:   [name], am serving as a scribe for Dr. Hulan Saas. This visit occurred during the SARS-CoV-2 public health emergency.  Safety protocols were in place, including screening questions prior to the visit, additional usage of staff PPE, and extensive cleaning of exam room while observing appropriate contact time as indicated for disinfecting solutions.  I'm seeing this patient by the request  of:  Vivi Barrack, MD  CC: Low back pain  [id number]  02/09/2021 Patient did have hemorrhagic bursitis noted.  Patient did have aspiration done today with improvement in range of motion.  Patient did have the discomfort.  Discussed with patient about icing regimen and home exercises, discussed which activities to do which wants to avoid.  Increase activity total.  Patient was able to ambulate with no significant difficulty.  Patient warned that there is a possibility for reaccumulation.  Discussed compression.  Follow-up again in 3 weeks  Updated 02/22/2021 Randall Estes is a 66 y.o. male coming in with complaint of R sided lumbar spine pain. Pain began beginning of the year. Patient notes sharp pain. Take naproxen in the mornings. Denies any radiating symptoms.   Xray IMPRESSION: Mild anterior soft tissue swelling without evidence of an acute fracture.     Past Medical History:  Diagnosis Date   Basal cell carcinoma 02/10/2019   sup-left post shoulder (CX35FU)   Coronary artery disease    Hyperlipidemia    Hypertension    Old MI (myocardial infarction) 2009   Past Surgical History:  Procedure Laterality Date   CORONARY ANGIOPLASTY     DENTAL SURGERY     NASAL SINUS SURGERY     VARICOSE VEIN SURGERY     Social History   Socioeconomic History   Marital status: Married    Spouse name: Not on file   Number of children: Not on file   Years of education: Not  on file   Highest education level: Not on file  Occupational History   Not on file  Tobacco Use   Smoking status: Former   Smokeless tobacco: Never   Tobacco comments:    2009  Substance and Sexual Activity   Alcohol use: Yes    Comment: 14   Drug use: Never   Sexual activity: Yes    Partners: Female  Other Topics Concern   Not on file  Social History Narrative   Not on file   Social Determinants of Health   Financial Resource Strain: Not on file  Food Insecurity: Not on file  Transportation Needs: Not on file  Physical Activity: Not on file  Stress: Not on file  Social Connections: Not on file   No Known Allergies Family History  Problem Relation Age of Onset   Prostate cancer Father    Cancer Paternal Grandmother    Heart disease Neg Hx      Current Outpatient Medications (Cardiovascular):    atorvastatin (LIPITOR) 40 MG tablet, Take 40 mg by mouth daily.   carvedilol (COREG) 6.25 MG tablet, TAKE 1 TABLET BY MOUTH TWICE A DAY   ezetimibe (ZETIA) 10 MG tablet, Take 1 tablet (10 mg total) by mouth daily.   losartan (COZAAR) 50 MG tablet, Take 1 tablet (50 mg total) by mouth in the morning and at bedtime.   Current Outpatient Medications (Analgesics):    aspirin 81 MG tablet, Take 1 tablet (81 mg total) by mouth daily.  Current Outpatient Medications (Other):    cholecalciferol (VITAMIN D3) 25 MCG (1000 UNIT) tablet, Take 1,000 Units by mouth daily.   Coenzyme Q10 (COQ10) 100 MG CAPS, Take 200 mg by mouth daily.   Crisaborole (EUCRISA) 2 % OINT, Apply 1 application topically daily.   Multiple Vitamin (ONE DAILY) tablet, Take by mouth.   thiamine (VITAMIN B-1) 50 MG tablet, Take 50 mg by mouth daily.   zinc gluconate 50 MG tablet, Take by mouth.   Reviewed prior external information including notes and imaging from  primary care provider As well as notes that were available from care everywhere and other healthcare systems.  Past medical history, social,  surgical and family history all reviewed in electronic medical record.  No pertanent information unless stated regarding to the chief complaint.   Review of Systems:  No headache, visual changes, nausea, vomiting, diarrhea, constipation, dizziness, abdominal pain, skin rash, fevers, chills, night sweats, weight loss, swollen lymph nodes, body aches, joint swelling, chest pain, shortness of breath, mood changes. POSITIVE muscle aches  Objective  Blood pressure (!) 142/82, pulse 82, height 6\' 1"  (1.854 m), weight 260 lb (117.9 kg), peak flow 97 L/min.   General: No apparent distress alert and oriented x3 mood and affect normal, dressed appropriately.  HEENT: Pupils equal, extraocular movements intact  Respiratory: Patient's speak in full sentences and does not appear short of breath  Cardiovascular: No lower extremity edema, non tender, no erythema  Gait normal with good balance and coordination.  MSK: Right knee has no significant swelling at the moment.  Patient is feeling much better and has full range of motion. Low back exam severe tenderness to palpation of the gluteal tendon proximally.  Close to the sacroiliac joint also.  Positive FABER test.  Negative straight leg test.  Procedure: Real-time Ultrasound Guided Injection of right gluteal tendon sheath Device: GE Logiq Q7 Ultrasound guided injection is preferred based studies that show increased duration, increased effect, greater accuracy, decreased procedural pain, increased response rate, and decreased cost with ultrasound guided versus blind injection.  Verbal informed consent obtained.  Time-out conducted.  Noted no overlying erythema, induration, or other signs of local infection.  Skin prepped in a sterile fashion.  Local anesthesia: Topical Ethyl chloride.  With sterile technique and under real time ultrasound guidance: With a 21-gauge 2 inch needle injected into the gluteal tendon with a total of 2 cc of 0.5% Marcaine and 1 cc  of Kenalog 40 mg/mL.  No blood loss.  Band-Aid placed. Completed without difficulty  Pain immediately improved suggesting accurate placement of the medication.  Advised to call if fevers/chills, erythema, induration, drainage, or persistent bleeding.  Impression: Technically successful ultrasound guided injection.     Impression and Recommendations:    The above documentation has been reviewed and is accurate and complete Randall Pulley, DO

## 2021-02-22 ENCOUNTER — Ambulatory Visit: Payer: 59 | Admitting: Family Medicine

## 2021-02-22 ENCOUNTER — Other Ambulatory Visit: Payer: Self-pay

## 2021-02-22 ENCOUNTER — Ambulatory Visit: Payer: Self-pay

## 2021-02-22 ENCOUNTER — Encounter: Payer: Self-pay | Admitting: Family Medicine

## 2021-02-22 VITALS — BP 142/82 | HR 82 | Ht 73.0 in | Wt 260.0 lb

## 2021-02-22 DIAGNOSIS — M25561 Pain in right knee: Secondary | ICD-10-CM | POA: Diagnosis not present

## 2021-02-22 DIAGNOSIS — M7051 Other bursitis of knee, right knee: Secondary | ICD-10-CM | POA: Diagnosis not present

## 2021-02-22 DIAGNOSIS — M7601 Gluteal tendinitis, right hip: Secondary | ICD-10-CM | POA: Diagnosis not present

## 2021-02-22 NOTE — Assessment & Plan Note (Addendum)
Patient only got a year and a half out of injection.  Did another 1 today.  Hoping the patient does make significant improvement again.  We have discussed with patient that likely he does not have significant amount of arthritic changes of the back but does need to continue to work on core strengthening.  Patient does go to the gym fairly regularly.  Discussed hip abductor strengthening.  Increase activity slowly.  Follow-up again in 6 to 8 weeks.  Worsening pain repeat x-rays will be necessary.

## 2021-02-22 NOTE — Assessment & Plan Note (Signed)
Completely resolved after the aspiration.

## 2021-02-22 NOTE — Patient Instructions (Signed)
Good to see you Might be numb Hope to get another year out of injection See me again in 6 weeks just in case

## 2021-04-05 ENCOUNTER — Ambulatory Visit: Payer: 59 | Admitting: Family Medicine

## 2021-04-07 ENCOUNTER — Other Ambulatory Visit: Payer: Self-pay | Admitting: Cardiovascular Disease

## 2021-04-12 ENCOUNTER — Ambulatory Visit: Payer: 59 | Admitting: Dermatology

## 2021-05-24 ENCOUNTER — Other Ambulatory Visit: Payer: Self-pay | Admitting: Family Medicine

## 2021-05-24 MED ORDER — ATORVASTATIN CALCIUM 40 MG PO TABS: 40.0000 mg | ORAL_TABLET | Freq: Every day | ORAL | 3 refills | Status: DC

## 2021-05-30 ENCOUNTER — Ambulatory Visit: Payer: 59 | Admitting: Family Medicine

## 2021-08-12 ENCOUNTER — Other Ambulatory Visit: Payer: Self-pay | Admitting: Cardiovascular Disease

## 2021-08-12 DIAGNOSIS — I10 Essential (primary) hypertension: Secondary | ICD-10-CM

## 2021-10-22 IMAGING — DX DG KNEE 3 VIEWS*L*
3 series · 3 of 3 positions shown · non-contrast
Comparison: None.

CLINICAL DATA: Knee pain

EXAM:
LEFT KNEE - 3 VIEW

[knee ap]
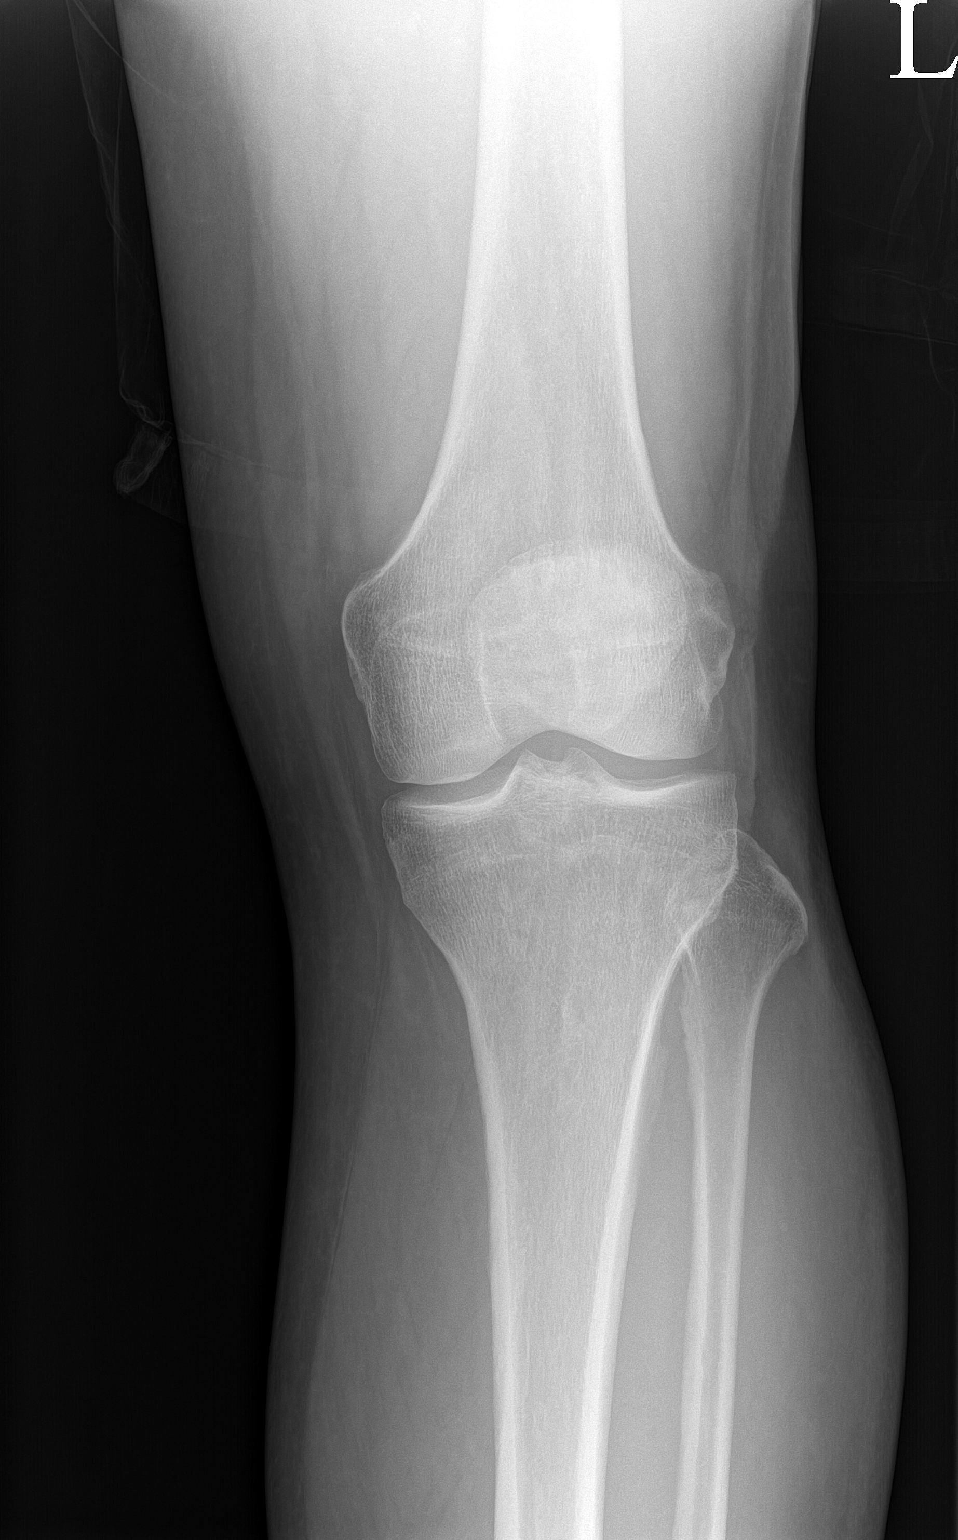

[knee lat]
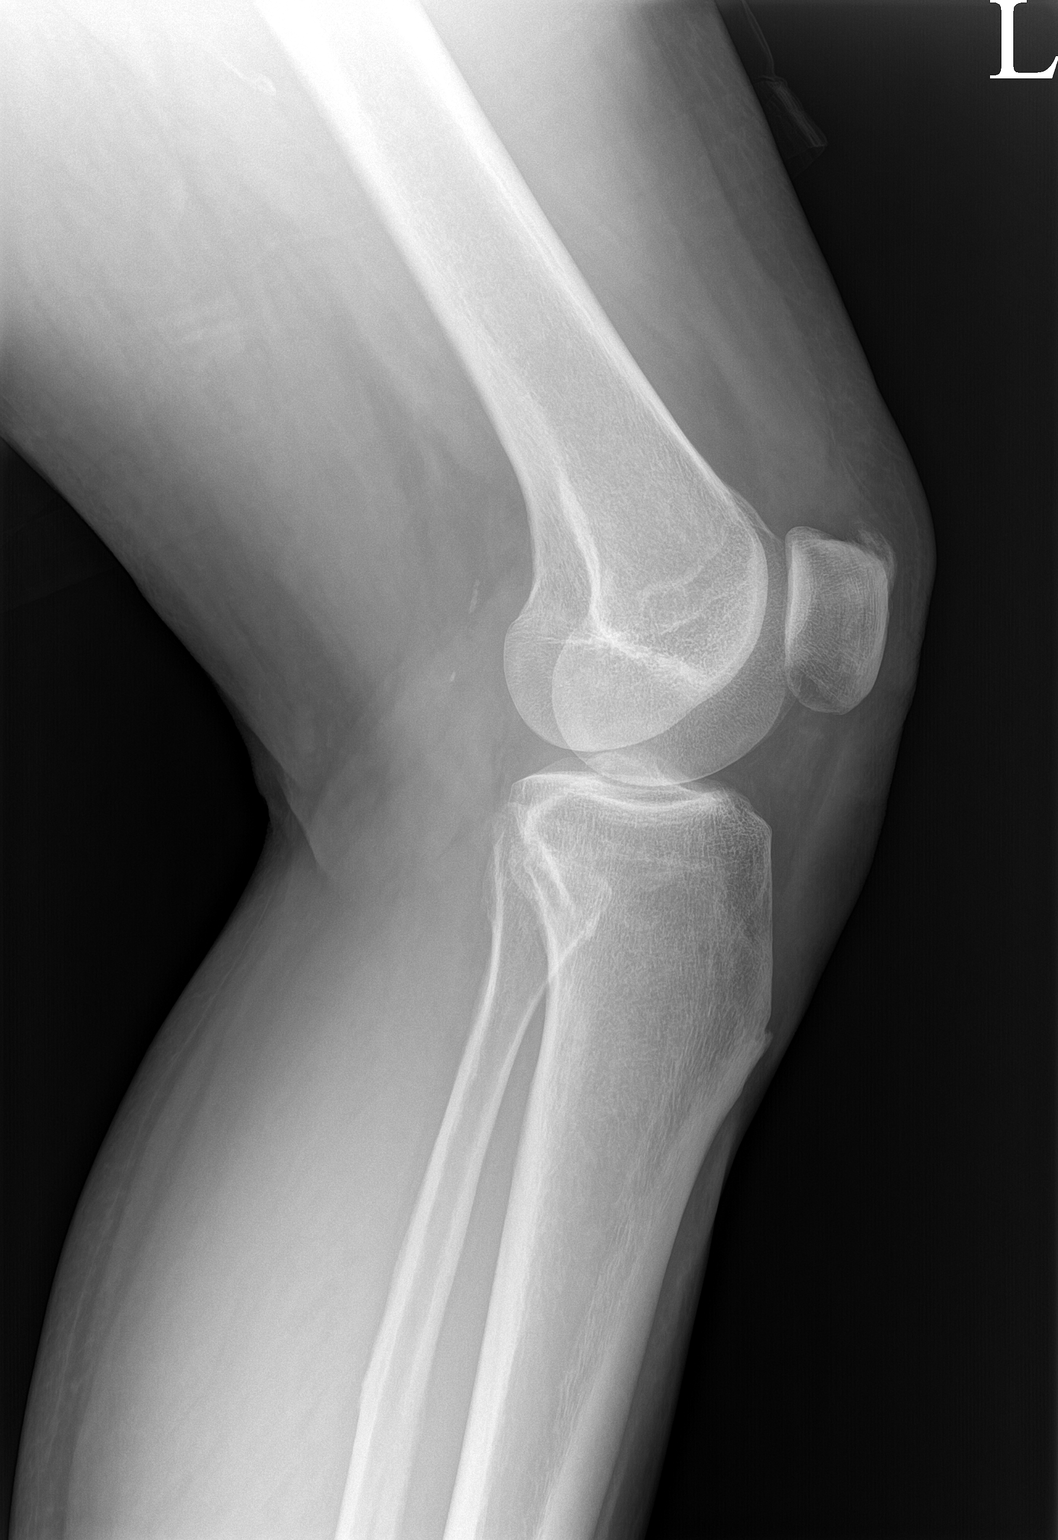

[patella]
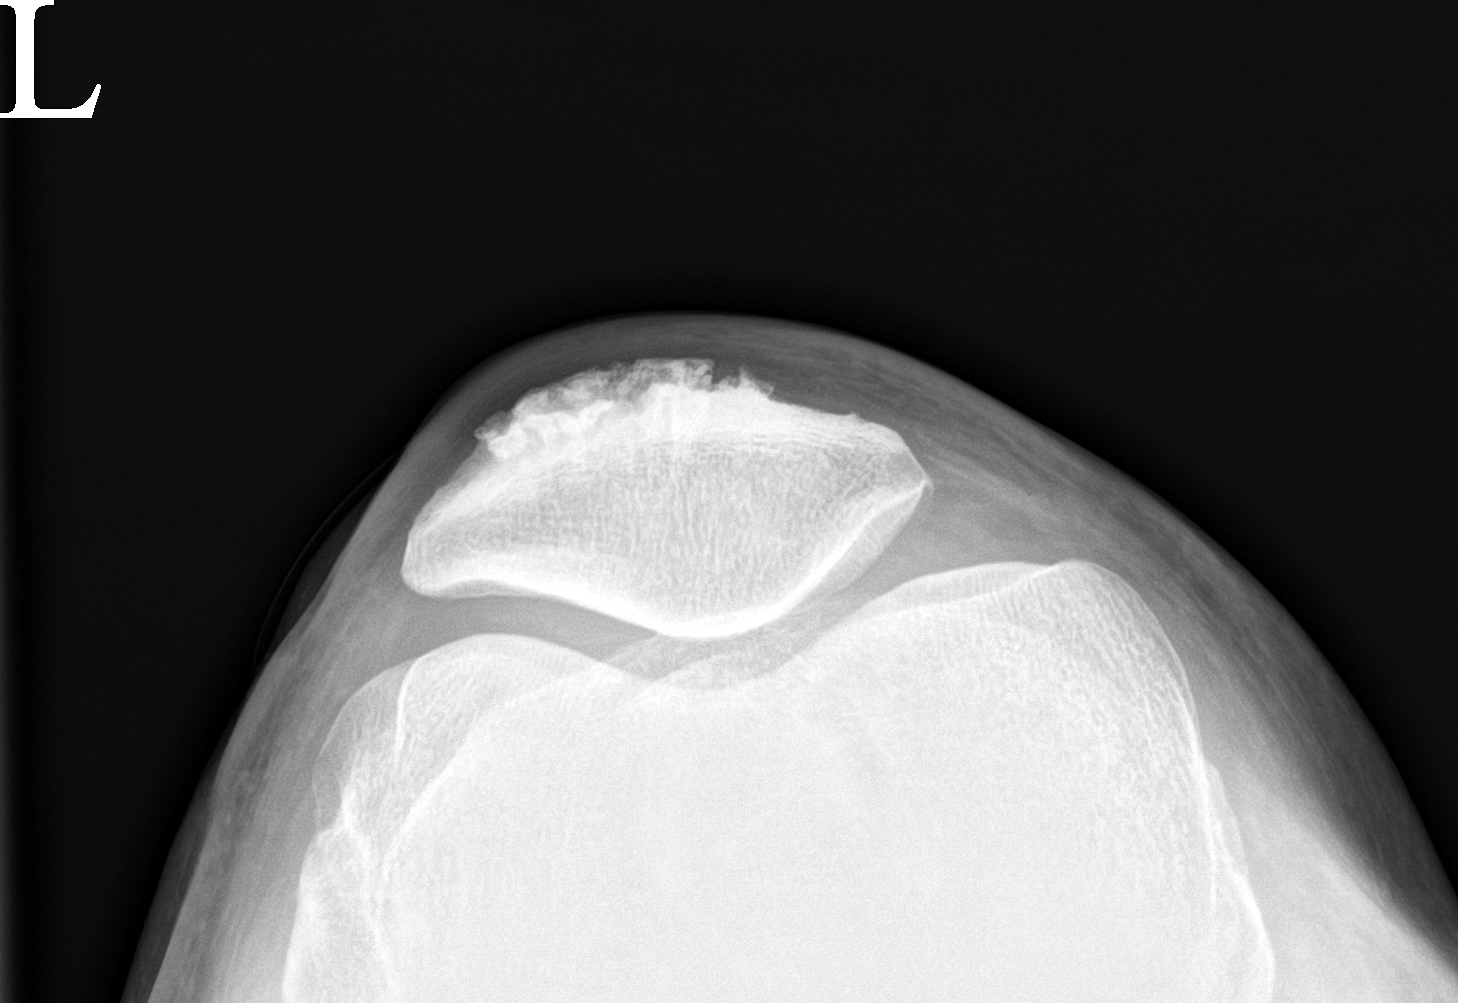

[3 of 3 positions shown; findings below may reference images not displayed]

FINDINGS: No evidence of fracture, dislocation, or joint effusion. Joint
spaces are preserved. No evidence of arthropathy or other focal bone
abnormality. Small enthesophyte at the quadriceps tendon insertion.
Scattered vascular calcifications. Soft tissues are unremarkable.
IMPRESSION: Negative.

## 2021-11-06 ENCOUNTER — Encounter: Payer: Self-pay | Admitting: *Deleted

## 2022-01-09 IMAGING — DX DG KNEE AP/LAT W/ SUNRISE*R*
3 series · 3 of 3 positions shown · non-contrast
Comparison: None.

CLINICAL DATA: Anterior right knee pain and swelling status post
trauma 1 week ago.

EXAM:
RIGHT KNEE 3 VIEWS

[knee ap]
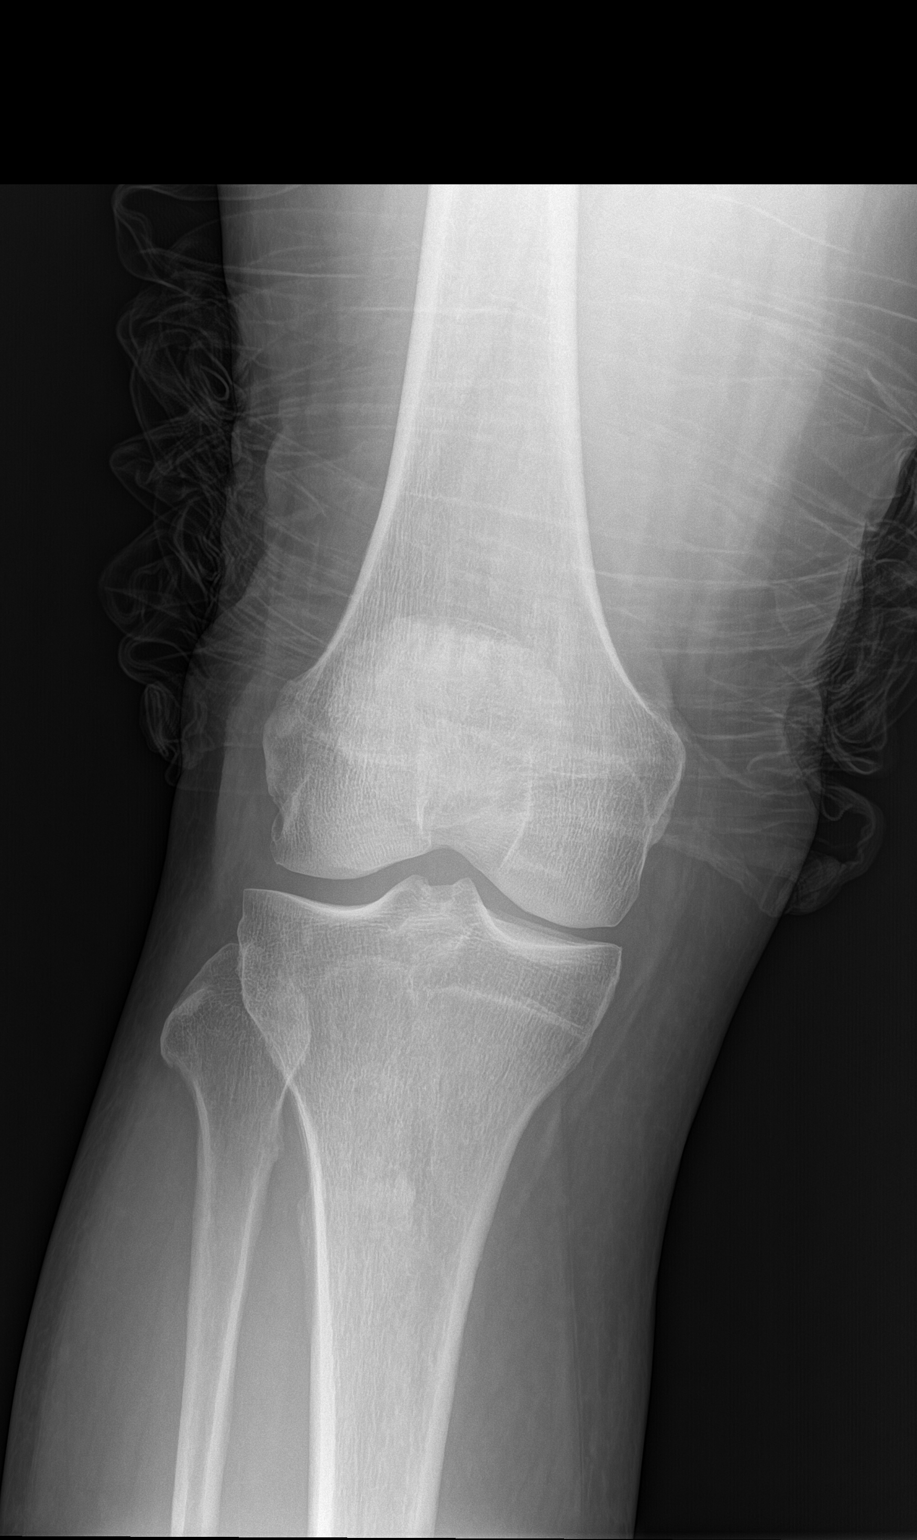

[knee lat]
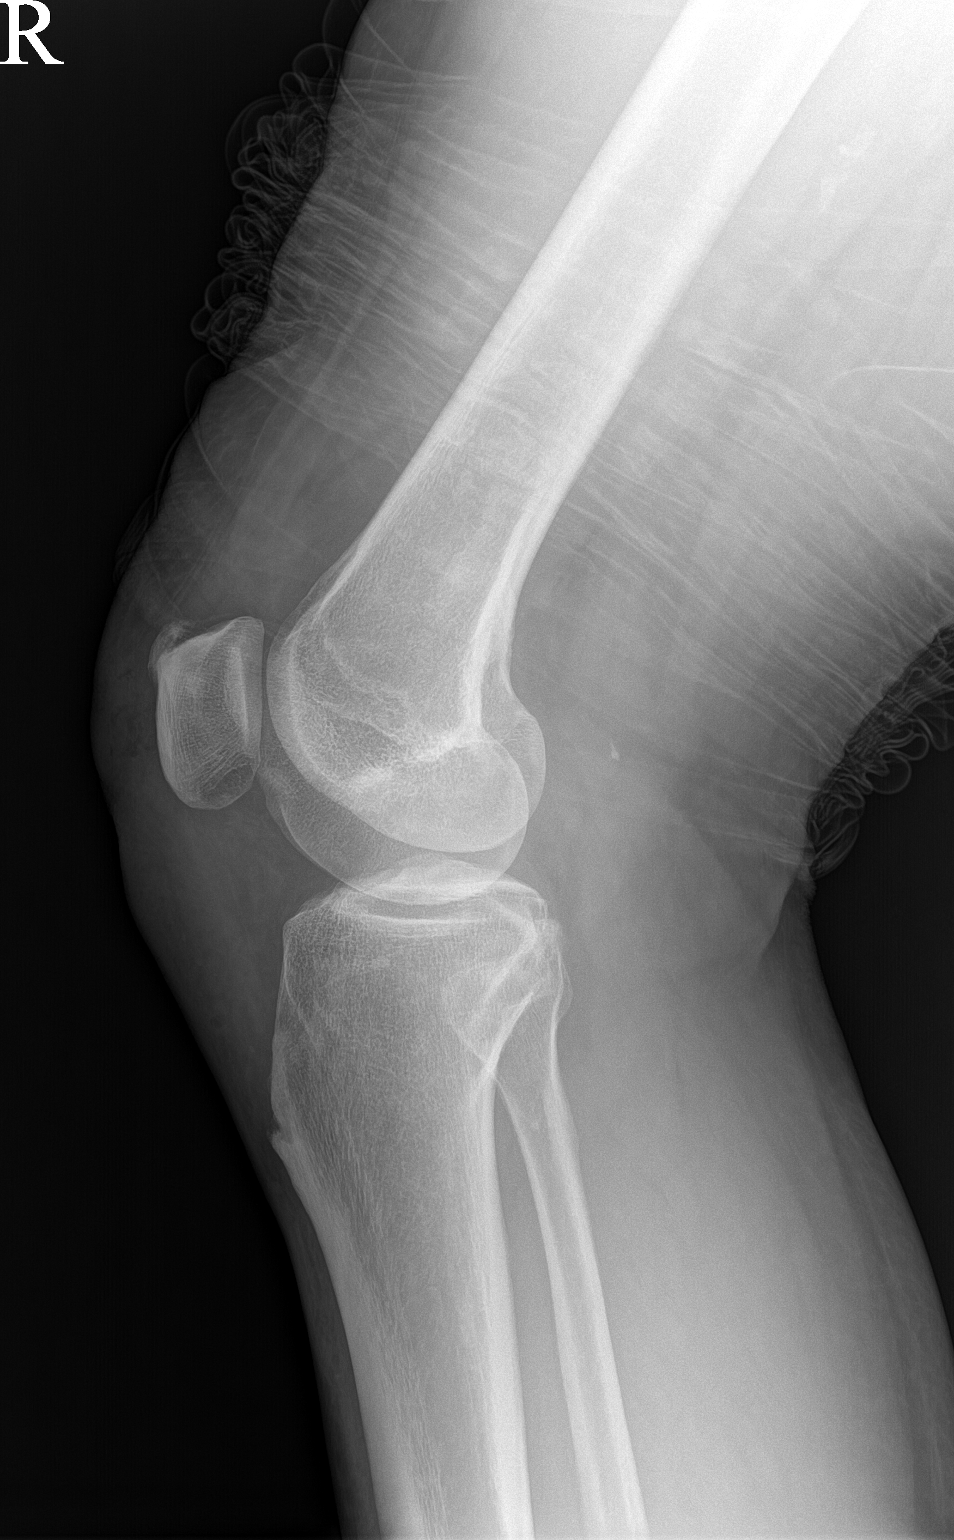

[patella]
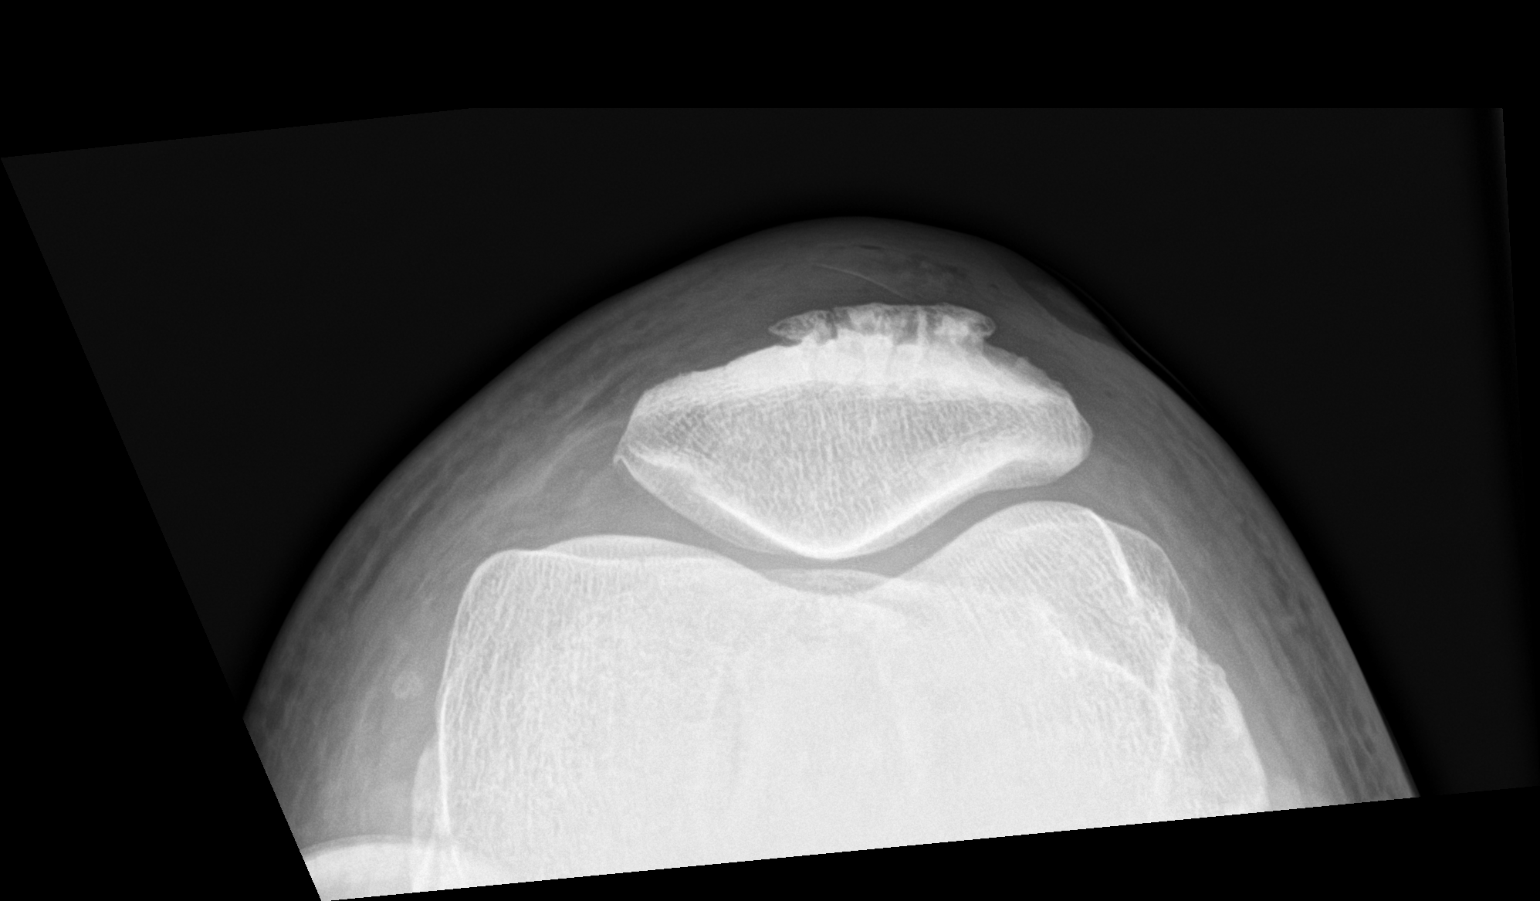

[3 of 3 positions shown; findings below may reference images not displayed]

FINDINGS: No evidence of an acute fracture, dislocation, or joint effusion.
Chronic changes are seen along the dorsal aspect of the right
patella. Mild anterior soft tissue swelling is noted.
IMPRESSION: Mild anterior soft tissue swelling without evidence of an acute
fracture.

## 2022-01-23 ENCOUNTER — Ambulatory Visit: Payer: Self-pay

## 2022-01-23 ENCOUNTER — Ambulatory Visit: Payer: 59 | Admitting: Family Medicine

## 2022-01-23 VITALS — BP 150/90 | HR 60 | Ht 73.0 in | Wt 260.0 lb

## 2022-01-23 DIAGNOSIS — M25511 Pain in right shoulder: Secondary | ICD-10-CM

## 2022-01-23 DIAGNOSIS — M19011 Primary osteoarthritis, right shoulder: Secondary | ICD-10-CM

## 2022-01-23 NOTE — Progress Notes (Signed)
[facility] [place] Phone: [phone] Subjective:   Randall Estes, am serving as a scribe for Dr. Hulan Saas.  I'm seeing this patient by the request  of:  Vivi Barrack, MD  CC: Shoulder pain   [id number]  Randall Estes is a 66 y.o. male coming in with complaint of right shoulder that has been going on for about a month or two. Patient locates the pain to front of shoulder no radiating pain. Patient states this weekend he had a bad episode that woke him up and he cannot lay on the right side. Patient declines any weakness, numbness or tingling and no mechanical symptoms. Using topical voltaren.       Past Medical History:  Diagnosis Date   Basal cell carcinoma 02/10/2019   sup-left post shoulder (CX35FU)   Coronary artery disease    Hyperlipidemia    Hypertension    Old MI (myocardial infarction) 2009   Past Surgical History:  Procedure Laterality Date   CORONARY ANGIOPLASTY     DENTAL SURGERY     NASAL SINUS SURGERY     VARICOSE VEIN SURGERY     Social History   Socioeconomic History   Marital status: Married    Spouse name: Not on file   Number of children: Not on file   Years of education: Not on file   Highest education level: Not on file  Occupational History   Not on file  Tobacco Use   Smoking status: Former   Smokeless tobacco: Never   Tobacco comments:    2009  Substance and Sexual Activity   Alcohol use: Yes    Comment: 14   Drug use: Never   Sexual activity: Yes    Partners: Female  Other Topics Concern   Not on file  Social History Narrative   Not on file   Social Determinants of Health   Financial Resource Strain: Not on file  Food Insecurity: Not on file  Transportation Needs: Not on file  Physical Activity: Not on file  Stress: Not on file  Social Connections: Not on file   No Known Allergies Family History  Problem Relation Age of Onset   Prostate  cancer Father    Cancer Paternal Grandmother    Heart disease Neg Hx      Current Outpatient Medications (Cardiovascular):    atorvastatin (LIPITOR) 40 MG tablet, Take 1 tablet (40 mg total) by mouth daily.   carvedilol (COREG) 6.25 MG tablet, TAKE 1 TABLET BY MOUTH TWICE A DAY   losartan (COZAAR) 50 MG tablet, TAKE 1 TABLET (50 MG TOTAL) BY MOUTH IN THE MORNING AND AT BEDTIME.   Current Outpatient Medications (Analgesics):    aspirin 81 MG tablet, Take 1 tablet (81 mg total) by mouth daily.   Current Outpatient Medications (Other):    cholecalciferol (VITAMIN D3) 25 MCG (1000 UNIT) tablet, Take 1,000 Units by mouth daily.   Coenzyme Q10 (COQ10) 100 MG CAPS, Take 200 mg by mouth daily.   Crisaborole (EUCRISA) 2 % OINT, Apply 1 application topically daily.   diclofenac Sodium (VOLTAREN) 1 % GEL, Apply 2 g topically 4 (four) times daily.   Multiple Vitamin (ONE DAILY) tablet, Take by mouth.   thiamine (VITAMIN B-1) 50 MG tablet, Take 50 mg by mouth daily.   zinc gluconate 50 MG tablet, Take by mouth.   Reviewed prior external information including notes and imaging from  primary care provider As  well as notes that were available from care everywhere and other healthcare systems.  Past medical history, social, surgical and family history all reviewed in electronic medical record.  No pertanent information unless stated regarding to the chief complaint.   Review of Systems:  No headache, visual changes, nausea, vomiting, diarrhea, constipation, dizziness, abdominal pain, skin rash, fevers, chills, night sweats, weight loss, swollen lymph nodes, body aches, joint swelling, chest pain, shortness of breath, mood changes. POSITIVE muscle aches  Objective  Blood pressure (!) 150/90, pulse 60, height '6\' 1"'$  (1.854 m), weight 260 lb (117.9 kg), SpO2 97 %.   General: No apparent distress alert and oriented x3 mood and affect normal, dressed appropriately.  HEENT: Pupils equal, extraocular  movements intact  Respiratory: Patient's speak in full sentences and does not appear short of breath  Cardiovascular: No lower extremity edema, non tender, no erythema  Shoulder exam shows some limited range of motion.  Patient also has some limited external range of strength.  Positive crossover.  Rotator cuff strength does appear to continue to be intact.   Procedure: Real-time Ultrasound Guided Injection of right glenohumeral joint Device: GE Logiq Q7  Ultrasound guided injection is preferred based studies that show increased duration, increased effect, greater accuracy, decreased procedural pain, increased response rate with ultrasound guided versus blind injection.  Verbal informed consent obtained.  Time-out conducted.  Noted no overlying erythema, induration, or other signs of local infection.  Skin prepped in a sterile fashion.  Local anesthesia: Topical Ethyl chloride.  With sterile technique and under real time ultrasound guidance:  Joint visualized.  23g 1  inch needle inserted posterior approach. Pictures taken for needle placement. Patient did have injection of 2 cc of 1% lidocaine, 2 cc of 0.5% Marcaine, and 1.0 cc of Kenalog 40 mg/dL. Completed without difficulty  Pain immediately resolved suggesting accurate placement of the medication.  Advised to call if fevers/chills, erythema, induration, drainage, or persistent bleeding.  Impression: Technically successful ultrasound guided injection.  Procedure: Real-time Ultrasound Guided Injection of right acromioclavicular joint Device: GE Logiq Q7 Ultrasound guided injection is preferred based studies that show increased duration, increased effect, greater accuracy, decreased procedural pain, increased response rate, and decreased cost with ultrasound guided versus blind injection.  Verbal informed consent obtained.  Time-out conducted.  Noted no overlying erythema, induration, or other signs of local infection.  Skin prepped in a  sterile fashion.  Local anesthesia: Topical Ethyl chloride.  With sterile technique and under real time ultrasound guidance: With a 25-gauge half inch needle injected with 0.5 cc of 0.5% Marcaine and 0.5 cc of Kenalog 40 mg/mL Completed without difficulty  Pain immediately resolved suggesting accurate placement of the medication.  Advised to call if fevers/chills, erythema, induration, drainage, or persistent bleeding.  Impression: Technically successful ultrasound guided injection.   Impression and Recommendations:     The above documentation has been reviewed and is accurate and complete [name], DO

## 2022-01-23 NOTE — Patient Instructions (Signed)
Good to see you  Two injections in right shoulder today  Follow up in

## 2022-01-23 NOTE — Assessment & Plan Note (Signed)
Inferior glenohumeral arthritis.  Has responded well to injections previously.  Discussed with patient icing regimen and home exercises, which activities to do and which ones to avoid.  Increase activity slowly otherwise.  Follow-up with me again 6 to 8 weeks.

## 2022-01-23 NOTE — Assessment & Plan Note (Signed)
Injection given today, tolerated the procedure well, chronic problem with worsening symptoms.  Still wants to avoid any surgical intervention.  Discussed icing regimen and home exercises.  Follow-up again in 6 to 8 weeks.

## 2022-01-25 ENCOUNTER — Encounter: Payer: Self-pay | Admitting: *Deleted

## 2022-01-30 ENCOUNTER — Ambulatory Visit: Payer: 59 | Admitting: Family Medicine

## 2022-01-30 ENCOUNTER — Encounter: Payer: Self-pay | Admitting: Cardiovascular Disease

## 2022-01-30 ENCOUNTER — Ambulatory Visit: Payer: 59 | Attending: Cardiovascular Disease | Admitting: Cardiovascular Disease

## 2022-01-30 VITALS — BP 152/78 | HR 62 | Ht 73.0 in | Wt 258.2 lb

## 2022-01-30 DIAGNOSIS — E782 Mixed hyperlipidemia: Secondary | ICD-10-CM

## 2022-01-30 DIAGNOSIS — R002 Palpitations: Secondary | ICD-10-CM | POA: Diagnosis not present

## 2022-01-30 DIAGNOSIS — I10 Essential (primary) hypertension: Secondary | ICD-10-CM

## 2022-01-30 DIAGNOSIS — I25810 Atherosclerosis of coronary artery bypass graft(s) without angina pectoris: Secondary | ICD-10-CM | POA: Diagnosis not present

## 2022-01-30 DIAGNOSIS — G4733 Obstructive sleep apnea (adult) (pediatric): Secondary | ICD-10-CM

## 2022-01-30 LAB — CBC
Hematocrit: 41.9 % (ref 37.5–51.0)
Hemoglobin: 14.3 g/dL (ref 13.0–17.7)
MCH: 32.6 pg (ref 26.6–33.0)
MCHC: 34.1 g/dL (ref 31.5–35.7)
MCV: 95 fL (ref 79–97)
Platelets: 272 10*3/uL (ref 150–450)
RBC: 4.39 x10E6/uL (ref 4.14–5.80)
RDW: 12.1 % (ref 11.6–15.4)
WBC: 8.5 10*3/uL (ref 3.4–10.8)

## 2022-01-30 LAB — COMPREHENSIVE METABOLIC PANEL
ALT: 19 IU/L (ref 0–44)
AST: 19 IU/L (ref 0–40)
Albumin/Globulin Ratio: 2 (ref 1.2–2.2)
Albumin: 4.5 g/dL (ref 3.9–4.9)
Alkaline Phosphatase: 51 IU/L (ref 44–121)
BUN/Creatinine Ratio: 18 (ref 10–24)
BUN: 23 mg/dL (ref 8–27)
Bilirubin Total: 0.6 mg/dL (ref 0.0–1.2)
CO2: 26 mmol/L (ref 20–29)
Calcium: 10.1 mg/dL (ref 8.6–10.2)
Chloride: 99 mmol/L (ref 96–106)
Creatinine, Ser: 1.3 mg/dL — ABNORMAL HIGH (ref 0.76–1.27)
Globulin, Total: 2.2 g/dL (ref 1.5–4.5)
Glucose: 109 mg/dL — ABNORMAL HIGH (ref 70–99)
Potassium: 5.1 mmol/L (ref 3.5–5.2)
Sodium: 137 mmol/L (ref 134–144)
Total Protein: 6.7 g/dL (ref 6.0–8.5)
eGFR: 61 mL/min/{1.73_m2} (ref >59)

## 2022-01-30 LAB — LIPID PANEL
Chol/HDL Ratio: 2.4 ratio (ref 0.0–5.0)
Cholesterol, Total: 168 mg/dL (ref 100–199)
HDL: 70 mg/dL (ref >39)
LDL Chol Calc (NIH): 84 mg/dL (ref 0–99)
Triglycerides: 72 mg/dL (ref 0–149)
VLDL Cholesterol Cal: 14 mg/dL (ref 5–40)

## 2022-01-30 LAB — HEMOGLOBIN A1C
Est. average glucose Bld gHb Est-mCnc: 114 mg/dL
Hgb A1c MFr Bld: 5.6 % (ref 4.8–5.6)

## 2022-01-30 LAB — TSH: TSH: 2.52 u[IU]/mL (ref 0.450–4.500)

## 2022-01-30 LAB — PSA: Prostate Specific Ag, Serum: 0.4 ng/mL (ref 0.0–4.0)

## 2022-01-30 NOTE — Assessment & Plan Note (Signed)
History of hypertension blood pressure measured today at 152/78.  He is on carvedilol and losartan.

## 2022-01-30 NOTE — Assessment & Plan Note (Signed)
Remote history of obstructive sleep apnea status post nasal palatopharyngoplasty in 1998.

## 2022-01-30 NOTE — Assessment & Plan Note (Signed)
History of palpitations in the past which no longer are clinical issue.  He continues to drink 3 to 4 cups of coffee a day.  Event monitor performed 07/19/2019 showed occasional PVCs and short runs of nonsustained VT.

## 2022-01-30 NOTE — Assessment & Plan Note (Signed)
History of CAD status post anterior STEMI 08/16/2007 in [place] at which time he had stenting of his LAD.  He had preserved LV function.  He had a negative Myoview stress test 08/12/2015.  He denies chest pain or shortness of breath.

## 2022-01-30 NOTE — Assessment & Plan Note (Signed)
History of hyperlipidemia on statin therapy.  We will recheck a lipid liver profile 

## 2022-01-30 NOTE — Patient Instructions (Signed)
Medication Instructions:  Your physician recommends that you continue on your current medications as directed. Please refer to the Current Medication list given to you today.  *If you need a refill on your cardiac medications before your next appointment, please call your pharmacy*   Lab Work: Your physician recommends that you have labs drawn today: CMET, CBC, Lipids, TSH, A1C, PSA  If you have labs (blood work) drawn today and your tests are completely normal, you will receive your results only by: [place] (if you have MyChart) OR A paper copy in the mail If you have any lab test that is abnormal or we need to change your treatment, we will call you to review the results.   Follow-Up: At [facility], you and your health needs are our priority.  As part of our continuing mission to provide you with exceptional heart care, we have created designated Provider Care Teams.  These Care Teams include your primary Cardiologist (physician) and Advanced Practice Providers (APPs -  Physician Assistants and Nurse Practitioners) who all work together to provide you with the care you need, when you need it.  We recommend signing up for the patient portal called "MyChart".  Sign up information is provided on this After Visit Summary.  MyChart is used to connect with patients for Virtual Visits (Telemedicine).  Patients are able to view lab/test results, encounter notes, upcoming appointments, etc.  Non-urgent messages can be sent to your provider as well.   To learn more about what you can do with MyChart, go to [email or web address].    Your next appointment:   12 month(s)  The format for your next appointment:   In Person  Provider:   Quay Burow, MD

## 2022-01-30 NOTE — Progress Notes (Signed)
01/30/2022 [name]   [birth year]  [id number]  Primary Physician Vivi Barrack, MD Primary Cardiologist: Lorretta Harp MD Lupe Carney, [place]  HPI:  Randall Estes is a 66 y.o.  mildly overweight married Caucasian male father of 2, grandfather and 2 grandchildren who  does 3-D knitting. He is self-referred to be established in our practice because of known coronary artery disease for ongoing care. I last saw him in the office  07/14/2019.Marland Kitchen He has a history of 70 pack years of tobacco abuse having quit 08/16/07.He does drink 2-3 drinks a day He has treated hypertension and hyperlipidemia. He had an anterior STEMI 08/16/07 in [place] and had stenting of his LAD at that time. Apparently he had preserved LV function. He works out at [employer] 3 times a week and does cardiovascular workout for 45 minutes without symptoms.  He has a remote history of obstructive sleep apnea status post nasal palatopharyngoplasty back in 1998.   He was started on losartan by his PCP because of elevated hypertension.    He did admit to dietary indiscretion.  He works out several days a week in the gym without symptoms.   Since I saw him in the office 2 and half years ago he continues to do well.  He is still exercising in the gym.  The palpitations have resolved.  He denies chest pain or shortness of breath.   Current Meds  Medication Sig   aspirin 81 MG tablet Take 1 tablet (81 mg total) by mouth daily.   atorvastatin (LIPITOR) 40 MG tablet Take 1 tablet (40 mg total) by mouth daily.   carvedilol (COREG) 6.25 MG tablet TAKE 1 TABLET BY MOUTH TWICE A DAY   cholecalciferol (VITAMIN D3) 25 MCG (1000 UNIT) tablet Take 1,000 Units by mouth daily.   Coenzyme Q10 (COQ10) 100 MG CAPS Take 200 mg by mouth daily.   Crisaborole (EUCRISA) 2 % OINT Apply 1 application topically daily.   diclofenac Sodium (VOLTAREN) 1 % GEL Apply 2 g topically 4 (four) times daily.   losartan (COZAAR) 50 MG tablet TAKE 1  TABLET (50 MG TOTAL) BY MOUTH IN THE MORNING AND AT BEDTIME.   Multiple Vitamin (ONE DAILY) tablet Take by mouth.   thiamine (VITAMIN B-1) 50 MG tablet Take 50 mg by mouth daily.   zinc gluconate 50 MG tablet Take by mouth.     No Known Allergies  Social History   Socioeconomic History   Marital status: Married    Spouse name: Not on file   Number of children: Not on file   Years of education: Not on file   Highest education level: Not on file  Occupational History   Not on file  Tobacco Use   Smoking status: Former   Smokeless tobacco: Never   Tobacco comments:    2009  Substance and Sexual Activity   Alcohol use: Yes    Comment: 14   Drug use: Never   Sexual activity: Yes    Partners: Female  Other Topics Concern   Not on file  Social History Narrative   Not on file   Social Determinants of Health   Financial Resource Strain: Not on file  Food Insecurity: Not on file  Transportation Needs: Not on file  Physical Activity: Not on file  Stress: Not on file  Social Connections: Not on file  Intimate Partner Violence: Not on file     Review of Systems: General: negative for chills,  fever, night sweats or weight changes.  Cardiovascular: negative for chest pain, dyspnea on exertion, edema, orthopnea, palpitations, paroxysmal nocturnal dyspnea or shortness of breath Dermatological: negative for rash Respiratory: negative for cough or wheezing Urologic: negative for hematuria Abdominal: negative for nausea, vomiting, diarrhea, bright red blood per rectum, melena, or hematemesis Neurologic: negative for visual changes, syncope, or dizziness All other systems reviewed and are otherwise negative except as noted above.    Blood pressure (!) 152/78, pulse 62, height '6\' 1"'$  (1.854 m), weight 258 lb 3.2 oz (117.1 kg), SpO2 97 %.  General appearance: alert and no distress Neck: no adenopathy, no carotid bruit, no JVD, supple, symmetrical, trachea midline, and thyroid not  enlarged, symmetric, no tenderness/mass/nodules Lungs: clear to auscultation bilaterally Heart: regular rate and rhythm, S1, S2 normal, no murmur, click, rub or gallop Extremities: extremities normal, atraumatic, no cyanosis or edema Pulses: 2+ and symmetric Skin: Skin color, texture, turgor normal. No rashes or lesions Neurologic: Grossly normal  EKG sinus rhythm at 62 with septal Q waves and left axis deviation.  I personally reviewed this EKG.  ASSESSMENT AND PLAN:   Essential hypertension History of hypertension blood pressure measured today at 152/78.  He is on carvedilol and losartan.  Hyperlipidemia History of hyperlipidemia on statin therapy.  We will recheck a lipid liver profile.  CAD (coronary artery disease) of artery bypass graft History of CAD status post anterior STEMI 08/16/2007 in [place] at which time he had stenting of his LAD.  He had preserved LV function.  He had a negative Myoview stress test 08/12/2015.  He denies chest pain or shortness of breath.  Palpitations History of palpitations in the past which no longer are clinical issue.  He continues to drink 3 to 4 cups of coffee a day.  Event monitor performed 07/19/2019 showed occasional PVCs and short runs of nonsustained VT.  Obstructive sleep apnea Remote history of obstructive sleep apnea status post nasal palatopharyngoplasty in 1998.     Lorretta Harp MD FACP,FACC,FAHA, [facility] 01/30/2022 9:11 AM

## 2022-02-08 ENCOUNTER — Encounter: Payer: Self-pay | Admitting: Cardiovascular Disease

## 2022-02-08 DIAGNOSIS — E782 Mixed hyperlipidemia: Secondary | ICD-10-CM

## 2022-02-16 ENCOUNTER — Other Ambulatory Visit: Payer: Self-pay | Admitting: Cardiovascular Disease

## 2022-02-16 DIAGNOSIS — I10 Essential (primary) hypertension: Secondary | ICD-10-CM

## 2022-02-19 MED ORDER — EZETIMIBE 10 MG PO TABS: 10.0000 mg | ORAL_TABLET | Freq: Every day | ORAL | 3 refills | Status: DC

## 2022-02-27 ENCOUNTER — Ambulatory Visit: Payer: 59 | Admitting: Family Medicine

## 2022-04-03 ENCOUNTER — Encounter: Payer: Self-pay | Admitting: Nurse Practitioner

## 2022-04-03 ENCOUNTER — Ambulatory Visit: Payer: 59 | Attending: Nurse Practitioner | Admitting: Nurse Practitioner

## 2022-04-03 ENCOUNTER — Ambulatory Visit: Payer: 59 | Admitting: Cardiovascular Disease

## 2022-04-03 VITALS — BP 152/96 | HR 64 | Ht 73.0 in | Wt 256.8 lb

## 2022-04-03 DIAGNOSIS — E785 Hyperlipidemia, unspecified: Secondary | ICD-10-CM | POA: Diagnosis not present

## 2022-04-03 DIAGNOSIS — I10 Essential (primary) hypertension: Secondary | ICD-10-CM

## 2022-04-03 DIAGNOSIS — R002 Palpitations: Secondary | ICD-10-CM | POA: Diagnosis not present

## 2022-04-03 DIAGNOSIS — I2510 Atherosclerotic heart disease of native coronary artery without angina pectoris: Secondary | ICD-10-CM | POA: Diagnosis not present

## 2022-04-03 DIAGNOSIS — G4733 Obstructive sleep apnea (adult) (pediatric): Secondary | ICD-10-CM

## 2022-04-03 DIAGNOSIS — E782 Mixed hyperlipidemia: Secondary | ICD-10-CM

## 2022-04-03 MED ORDER — CARVEDILOL 6.25 MG PO TABS: 6.2500 mg | ORAL_TABLET | Freq: Two times a day (BID) | ORAL | 3 refills | Status: AC

## 2022-04-03 MED ORDER — EZETIMIBE 10 MG PO TABS: 10.0000 mg | ORAL_TABLET | Freq: Every day | ORAL | 3 refills | Status: AC

## 2022-04-03 MED ORDER — ATORVASTATIN CALCIUM 40 MG PO TABS: 40.0000 mg | ORAL_TABLET | Freq: Every day | ORAL | 3 refills | Status: AC

## 2022-04-03 MED ORDER — LOSARTAN POTASSIUM 50 MG PO TABS: 50.0000 mg | ORAL_TABLET | Freq: Two times a day (BID) | ORAL | 3 refills | Status: AC

## 2022-04-03 NOTE — Progress Notes (Unsigned)
Office Visit    Patient Name: Randall Estes Date of Encounter: 04/03/2022  Primary Care Provider:  Vivi Barrack, MD Primary Cardiologist:  Quay Burow, MD  Chief Complaint    67 year old male with a history of CAD s/p stenting-LAD in [place] in 2009, palpitations, hypertension, hyperlipidemia, and OSA who presents for follow-up related to CAD.  Past Medical History    Past Medical History:  Diagnosis Date   Basal cell carcinoma 02/10/2019   sup-left post shoulder ([id number])   Coronary artery disease    Hyperlipidemia    Hypertension    Old MI (myocardial infarction) 2009   Past Surgical History:  Procedure Laterality Date   CORONARY ANGIOPLASTY     DENTAL SURGERY     NASAL SINUS SURGERY     VARICOSE VEIN SURGERY      Allergies  No Known Allergies   Labs/Other Studies Reviewed    The following studies were reviewed today: Cardiac monitor 07/2019: 1. SR/SB 2. Occassional PVCs 3. Short runs of NSVT  Recent Labs: 01/30/2022: ALT 19; BUN 23; Creatinine, Ser 1.30; Hemoglobin 14.3; Platelets 272; Potassium 5.1; Sodium 137; TSH 2.520  Recent Lipid Panel    Component Value Date/Time   CHOL 168 01/30/2022 0923   TRIG 72 01/30/2022 0923   HDL 70 01/30/2022 0923   CHOLHDL 2.4 01/30/2022 0923   CHOLHDL 3 02/10/2020 0957   VLDL 38.4 02/10/2020 0957   LDLCALC 84 01/30/2022 0923    History of Present Illness    67 year old male with the above past medical history including CAD s/p stenting-LAD in [place] in 2009, palpitations, hypertension, hyperlipidemia, and OSA.  He was hospitalized in 2009 in [place] in the setting of anterior ST EMI.  He underwent stenting of his LAD at the time.  He had reported preserved LV function.  He has a remote history of OSA s/p nasal palatopharyngoplasty in 1998.  Myoview in 2015 was negative for ischemia. Cardiac monitor in 2021 in the setting of palpitations showed sinus rhythm, sinus bradycardia, short runs of  NSVT, occasional PVCs.  He was last seen in the office on 01/30/2022 and was stable from a cardiac standpoint.  He denied symptoms concerning for angina.  He presents today for follow-up.  Since his last visit He will return for fasting lipids, LFTs.  He is not fasting today.  BP elevated.  Continue to monitor BP and report BP consistently greater than 130/80.  Could consider transitioning losartan to olmesartan (patient declines titration of losartan).  Versus addition of amlodipine.  He is moving to [place] in the next month.  Under significant stress.  He is searching for a new cardiologist in the area.  Follow-up as needed.  Stable with no anginal symptoms.  Denies any recent palpitations.  Not on CPAP as he previously had surgery.  LDL was 84 in 01/2022, he was started on Zetia.  Due for repeat fasting lipids, LFTs. CAD: Palpitations: Hypertension: Hyperlipidemia: OSA:  Disposition:    Home Medications    Current Outpatient Medications  Medication Sig Dispense Refill   aspirin 81 MG tablet Take 1 tablet (81 mg total) by mouth daily. 90 tablet 3   atorvastatin (LIPITOR) 40 MG tablet Take 1 tablet (40 mg total) by mouth daily. 90 tablet 3   carvedilol (COREG) 6.25 MG tablet TAKE 1 TABLET BY MOUTH TWICE A DAY 180 tablet 3   cholecalciferol (VITAMIN D3) 25 MCG (1000 UNIT) tablet Take 1,000 Units by mouth daily.     Coenzyme  Q10 (COQ10) 100 MG CAPS Take 200 mg by mouth daily. 90 capsule 3   Crisaborole (EUCRISA) 2 % OINT Apply 1 application topically daily. 100 g 4   diclofenac Sodium (VOLTAREN) 1 % GEL Apply 2 g topically 4 (four) times daily.     ezetimibe (ZETIA) 10 MG tablet Take 1 tablet (10 mg total) by mouth daily. 90 tablet 3   losartan (COZAAR) 50 MG tablet TAKE 1 TABLET (50 MG TOTAL) BY MOUTH IN THE MORNING AND AT BEDTIME. 180 tablet 3   Multiple Vitamin (ONE DAILY) tablet Take by mouth.     thiamine (VITAMIN B-1) 50 MG tablet Take 50 mg by mouth daily.     zinc gluconate 50 MG  tablet Take by mouth.     No current facility-administered medications for this visit.     Review of Systems    ***.  All other systems reviewed and are otherwise negative except as noted above.    Physical Exam    VS:  BP (!) 152/96   Pulse 64   Ht 6' 1"$  (1.854 m)   Wt 256 lb 12.8 oz (116.5 kg)   SpO2 96%   BMI 33.88 kg/m   GEN: Well nourished, well developed, in no acute distress. HEENT: normal. Neck: Supple, no JVD, carotid bruits, or masses. Cardiac: RRR, no murmurs, rubs, or gallops. No clubbing, cyanosis, edema.  Radials/DP/PT 2+ and equal bilaterally.  Respiratory:  Respirations regular and unlabored, clear to auscultation bilaterally. GI: Soft, nontender, nondistended, BS + x 4. MS: no deformity or atrophy. Skin: warm and dry, no rash. Neuro:  Strength and sensation are intact. Psych: Normal affect.  Accessory Clinical Findings    ECG personally reviewed by me today -no EKG in office today.  Lab Results  Component Value Date   WBC 8.5 01/30/2022   HGB 14.3 01/30/2022   HCT 41.9 01/30/2022   MCV 95 01/30/2022   PLT 272 01/30/2022   Lab Results  Component Value Date   CREATININE 1.30 (H) 01/30/2022   BUN 23 01/30/2022   NA 137 01/30/2022   K 5.1 01/30/2022   CL 99 01/30/2022   CO2 26 01/30/2022   Lab Results  Component Value Date   ALT 19 01/30/2022   AST 19 01/30/2022   ALKPHOS 51 01/30/2022   BILITOT 0.6 01/30/2022   Lab Results  Component Value Date   CHOL 168 01/30/2022   HDL 70 01/30/2022   LDLCALC 84 01/30/2022   TRIG 72 01/30/2022   CHOLHDL 2.4 01/30/2022    Lab Results  Component Value Date   HGBA1C 5.6 01/30/2022    Assessment & Plan    1.  ***  HYPERTENSION CONTROL Vitals:   04/03/22 1047 04/03/22 1119  BP: (!) 162/98 (!) 152/96    The patient's blood pressure is elevated above target today. {Click here to indicate intervention taken to address high BP   Refresh Note :1} *** In order to address the patient's elevated  BP:         Lenna Sciara, NP 04/03/2022, 11:19 AM

## 2022-04-03 NOTE — Patient Instructions (Signed)
Medication Instructions:  Your physician recommends that you continue on your current medications as directed. Please refer to the Current Medication list given to you today.   *If you need a refill on your cardiac medications before your next appointment, please call your pharmacy*   Lab Work: NONE ordered at this time of appointment   If you have labs (blood work) drawn today and your tests are completely normal, you will receive your results only by: [place] (if you have MyChart) OR A paper copy in the mail If you have any lab test that is abnormal or we need to change your treatment, we will call you to review the results.   Testing/Procedures: NONE ordered at this time of appointment     Follow-Up: At [facility], you and your health needs are our priority.  As part of our continuing mission to provide you with exceptional heart care, we have created designated Provider Care Teams.  These Care Teams include your primary Cardiologist (physician) and Advanced Practice Providers (APPs -  Physician Assistants and Nurse Practitioners) who all work together to provide you with the care you need, when you need it.  We recommend signing up for the patient portal called "MyChart".  Sign up information is provided on this After Visit Summary.  MyChart is used to connect with patients for Virtual Visits (Telemedicine).  Patients are able to view lab/test results, encounter notes, upcoming appointments, etc.  Non-urgent messages can be sent to your provider as well.   To learn more about what you can do with MyChart, go to [email or web address].    Your next appointment:    As needed  Provider:   Quay Burow, MD     Other Instructions Monitor Blood pressure. 130/80 is goal level. Return for labs.

## 2022-04-04 ENCOUNTER — Encounter: Payer: Self-pay | Admitting: Nurse Practitioner

## 2022-06-12 ENCOUNTER — Telehealth: Payer: Self-pay | Admitting: Cardiovascular Disease

## 2022-06-12 NOTE — Telephone Encounter (Signed)
Patient stated he has moved to [place] and will need a referral and health records to [facility], fax# [phone].  Ph# [phone].
